# Patient Record
Sex: Male | Born: 1955 | Race: White | Hispanic: No | Marital: Married | Smoking: Never smoker
Health system: Southern US, Community
[De-identification: ages and names within clinical notes are randomized; demographics above are authoritative.]

## PROBLEM LIST (undated history)

## (undated) DIAGNOSIS — S0990XA Unspecified injury of head, initial encounter: Secondary | ICD-10-CM

## (undated) DIAGNOSIS — Z789 Other specified health status: Secondary | ICD-10-CM

## (undated) DIAGNOSIS — J45909 Unspecified asthma, uncomplicated: Secondary | ICD-10-CM

## (undated) DIAGNOSIS — J309 Allergic rhinitis, unspecified: Secondary | ICD-10-CM

## (undated) HISTORY — PX: FRACTURE SURGERY: SHX138

## (undated) HISTORY — DX: Other specified health status: Z78.9

## (undated) HISTORY — PX: OTHER SURGICAL HISTORY: SHX169

## (undated) HISTORY — DX: Unspecified asthma, uncomplicated: J45.909

## (undated) HISTORY — DX: Unspecified injury of head, initial encounter: S09.90XA

## (undated) HISTORY — DX: Allergic rhinitis, unspecified: J30.9

---

## 2012-02-09 DIAGNOSIS — F411 Generalized anxiety disorder: Secondary | ICD-10-CM | POA: Diagnosis not present

## 2012-08-07 DIAGNOSIS — F411 Generalized anxiety disorder: Secondary | ICD-10-CM | POA: Diagnosis not present

## 2013-03-18 DIAGNOSIS — L259 Unspecified contact dermatitis, unspecified cause: Secondary | ICD-10-CM | POA: Diagnosis not present

## 2014-12-25 ENCOUNTER — Telehealth: Payer: Self-pay

## 2014-12-25 NOTE — Telephone Encounter (Signed)
PATIENT WIFE CALLED TO SCHEDULE COLONOSCOPY. RECEIVED LETTER  PLEASE CALL [phone]

## 2014-12-29 NOTE — Telephone Encounter (Signed)
Pt was referred by Dr. Dimas AguasHoward for screening colonoscopy. He said he has abdominal pain that has been going on for awhile and some problems with hemorrhoids. OV with Wynne DustEric Gill, NP on 01/14/2015 at 10:00 AM.

## 2015-01-14 ENCOUNTER — Ambulatory Visit: Payer: Self-pay | Admitting: Nurse Practitioner

## 2015-01-15 ENCOUNTER — Ambulatory Visit (INDEPENDENT_AMBULATORY_CARE_PROVIDER_SITE_OTHER): Payer: Medicare Other | Admitting: Nurse Practitioner

## 2015-01-15 ENCOUNTER — Other Ambulatory Visit: Payer: Self-pay

## 2015-01-15 ENCOUNTER — Encounter: Payer: Self-pay | Admitting: Nurse Practitioner

## 2015-01-15 VITALS — BP 126/72 | HR 67 | Temp 98.4°F | Ht 72.0 in | Wt 194.0 lb

## 2015-01-15 DIAGNOSIS — K5900 Constipation, unspecified: Secondary | ICD-10-CM | POA: Insufficient documentation

## 2015-01-15 DIAGNOSIS — R1030 Lower abdominal pain, unspecified: Secondary | ICD-10-CM

## 2015-01-15 DIAGNOSIS — K6289 Other specified diseases of anus and rectum: Secondary | ICD-10-CM

## 2015-01-15 DIAGNOSIS — Z1211 Encounter for screening for malignant neoplasm of colon: Secondary | ICD-10-CM | POA: Diagnosis not present

## 2015-01-15 DIAGNOSIS — R109 Unspecified abdominal pain: Secondary | ICD-10-CM

## 2015-01-15 MED ORDER — PEG-KCL-NACL-NASULF-NA ASC-C 100 G PO SOLR
1.0000 | Freq: Once | ORAL | Status: AC
Start: 2015-01-15 — End: 2015-02-14

## 2015-01-15 MED ORDER — HYDROCORTISONE ACETATE 25 MG RE SUPP: 25.0000 mg | Freq: Two times a day (BID) | RECTAL | Status: DC

## 2015-01-15 NOTE — Assessment & Plan Note (Signed)
Occasional lower abdominal pain, no pain today. No TTP. Has regular BMs but occasional incomplete emptying. Abdominal pain likely due to mild intermittent constipation/incomplete emptying. No red flag/warning symptoms/signs such as overt GI bleed, weight loss, change in bowel habits, or appetite. Will trial on Miralax 3 times a week. Due for a screening colonoscopy and will set that up. If no improvement in abdominal pain with Miralax and TCS is clean can consider further workup and treatment like abdominal imaging or different medication regimen.

## 2015-01-15 NOTE — Assessment & Plan Note (Signed)
Due to TCS, referred by PCP. Is having some lower GI pain intermittently along with sensation of incomplete emptying and rectal irritation/itching. Likely transient constipation, but cannot rule out other etiology. Will proceed with screening TCS as he is due. If no improvement in intermittent abdominal pain with medications and TCS is normal, can consider other workup such as abdominal imaging.  Proceed with TCS with Dr. Jena Gaussourk in near future: the risks, benefits, and alternatives have been discussed with the patient in detail. The patient states understanding and desires to proceed.

## 2015-01-15 NOTE — Progress Notes (Signed)
cc'ed to pcp °

## 2015-01-15 NOTE — Patient Instructions (Signed)
1. We will schedule you for your procedure (colonoscopy) 2. Further recommendations to be based on the results of your procedure 3. Use the Annusol suppository twice a day as needed for rectal irritation. Do not use for longer than 7 days at a time. 4. Try Miralax thrww times a week for more complete emptying of your bowels.

## 2015-01-15 NOTE — Assessment & Plan Note (Signed)
Patient with rectal irritation and itching with bowel movements. Has not tried any medications. No s/s of overt GI bleed. Likely hemorrhoids, rectal exam deferred due to pending screenign colonoscopy. Will Rx Anusol suppository bid for up to 7 days for symptomatic treatment.

## 2015-01-15 NOTE — Assessment & Plan Note (Signed)
Has regular bowel movement but a sensation of incomplete emptying as well as likely hemorrhoids ractal irritation. Denies s/s of overt GI bleed. Wil trial Miralax three times a day. Due for screening colonoscopy and will defer rectal exam until then.

## 2015-01-15 NOTE — Progress Notes (Signed)
Primary Care Physician:  Rory Percy, MD Primary Gastroenterologist:  Dr. Oneida Alar  Chief Complaint  Patient presents with  . Colonoscopy    HPI:   59 year old mae presents for screenign colonoscopy; unable to telepone triage because c/o persistnet abdominal pain and hemorrhoid issues. Abdominal pain is in his lower abdomen and is intermittent. Has about 1 bowel movement a day although admits sensation of incomplete emptying. Having rectal irritation and itching with each bowel movement. Had one episode of scant brbpr on toilet tissue a couple months ago, no recurrence. Has not tried any medications. Denies NSAID use, ASA powder use. Denies dysphagia, GERD symptoms. Does not like to take a lot of medications. Denies unintentional weight loss or changes in appetite. No change in bowel habits or consistency.  Past Medical History  Diagnosis Date  . Medical history non-contributory     Past Surgical History  Procedure Laterality Date  . None      Current Outpatient Prescriptions  Medication Sig Dispense Refill  . hydrocortisone (ANUSOL-HC) 25 MG suppository Place 1 suppository (25 mg total) rectally 2 (two) times daily. Do not use for longer than 7 days. 14 suppository 0  . peg 3350 powder (MOVIPREP) 100 G SOLR Take 1 kit (200 g total) by mouth once. 1 kit 0   No current facility-administered medications for this visit.    Allergies as of 01/15/2015  . (No Known Allergies)    Family History  Problem Relation Age of Onset  . Colon cancer Neg Hx     History   Social History  . Marital Status: Married    Spouse Name: N/A    Number of Children: N/A  . Years of Education: N/A   Occupational History  . Not on file.   Social History Main Topics  . Smoking status: Never Smoker   . Smokeless tobacco: Not on file  . Alcohol Use: No  . Drug Use: No  . Sexual Activity: Not on file   Other Topics Concern  . Not on file   Social History Narrative  . No narrative on  file    Review of Systems: Gen: Denies any fever, chills, fatigue, weight loss, lack of appetite.  CV: Denies chest pain, heart palpitations, syncope.  Resp: Denies shortness of breath at rest or with exertion. Denies wheezing. No shortness of breath laying flat. GI: See HPI. Denies dysphagia or odynophagia. Denies fecal incontinence. MS: Denies joint pain, muscle weakness, cramps, or limitation of movement.  Derm: Denies rash, itching, dry skin Psych: Denies depression, anxiety, memory loss, and confusion Heme: Denies bruising, bleeding, and enlarged lymph nodes.  Physical Exam: BP 126/72 mmHg  Pulse 67  Temp(Src) 98.4 F (36.9 C) (Oral)  Ht 6' (1.829 m)  Wt 194 lb (87.998 kg)  BMI 26.31 kg/m2 General:   Alert and oriented. Pleasant and cooperative. Well-nourished and well-developed.  Head:  Normocephalic and atraumatic. Eyes:  Without icterus, sclera clear and conjunctiva pink.  Ears:  Normal auditory acuity. Mouth:  No deformity or lesions, oral mucosa pink. No OP edema. Neck:  Supple, without mass or thyromegaly. Lungs:  Clear to auscultation bilaterally. No wheezes, rales, or rhonchi. No distress.  Heart:  S1, S2 present without murmurs appreciated.  Abdomen:  +BS, soft, non-tender and non-distended with particular attention to the lower abdomen. No HSM noted. No guarding or rebound. No masses appreciated.  Rectal:  Deferred  Msk:  Symmetrical without gross deformities. Normal posture. Pulses:  Normal DP pulses  noted. Extremities:  Without clubbing or edema. Neurologic:  Alert and  oriented x4;  grossly normal neurologically. Skin:  Intact without significant lesions or rashes. Cervical Nodes:  No significant cervical adenopathy. Psych:  Alert and cooperative. Normal mood and affect.     01/15/2015 12:02 PM

## 2015-02-04 ENCOUNTER — Encounter (HOSPITAL_COMMUNITY): Payer: Self-pay | Admitting: *Deleted

## 2015-02-04 ENCOUNTER — Ambulatory Visit (HOSPITAL_COMMUNITY)
Admission: RE | Admit: 2015-02-04 | Discharge: 2015-02-04 | Disposition: A | Discharge status: Home / Self Care | Payer: Medicare Other | Source: Ambulatory Visit | Attending: Internal Medicine | Admitting: Internal Medicine

## 2015-02-04 ENCOUNTER — Encounter (HOSPITAL_COMMUNITY)
Admission: RE | Disposition: A | Discharge status: Home / Self Care | Payer: Self-pay | Source: Ambulatory Visit | Attending: Internal Medicine

## 2015-02-04 DIAGNOSIS — K648 Other hemorrhoids: Secondary | ICD-10-CM | POA: Diagnosis not present

## 2015-02-04 DIAGNOSIS — K644 Residual hemorrhoidal skin tags: Secondary | ICD-10-CM | POA: Diagnosis not present

## 2015-02-04 DIAGNOSIS — R109 Unspecified abdominal pain: Secondary | ICD-10-CM

## 2015-02-04 DIAGNOSIS — K6389 Other specified diseases of intestine: Secondary | ICD-10-CM | POA: Diagnosis not present

## 2015-02-04 DIAGNOSIS — K5900 Constipation, unspecified: Secondary | ICD-10-CM

## 2015-02-04 DIAGNOSIS — K6289 Other specified diseases of anus and rectum: Secondary | ICD-10-CM

## 2015-02-04 DIAGNOSIS — Z1211 Encounter for screening for malignant neoplasm of colon: Secondary | ICD-10-CM | POA: Diagnosis present

## 2015-02-04 HISTORY — PX: COLONOSCOPY: SHX5424

## 2015-02-04 SURGERY — COLONOSCOPY
Anesthesia: Moderate Sedation

## 2015-02-04 MED ORDER — ONDANSETRON HCL 4 MG/2ML IJ SOLN
INTRAMUSCULAR | Status: DC | PRN
  Administered 2015-02-04: 4 mg via INTRAVENOUS

## 2015-02-04 MED ORDER — MEPERIDINE HCL 100 MG/ML IJ SOLN
INTRAMUSCULAR | Status: AC
  Filled 2015-02-04: qty 2

## 2015-02-04 MED ORDER — MIDAZOLAM HCL 5 MG/5ML IJ SOLN
INTRAMUSCULAR | Status: AC
  Filled 2015-02-04: qty 10

## 2015-02-04 MED ORDER — MIDAZOLAM HCL 5 MG/5ML IJ SOLN
INTRAMUSCULAR | Status: DC | PRN
  Administered 2015-02-04: 1 mg via INTRAVENOUS
  Administered 2015-02-04: 2 mg via INTRAVENOUS
  Administered 2015-02-04 (×2): 1 mg via INTRAVENOUS
  Administered 2015-02-04: 2 mg via INTRAVENOUS

## 2015-02-04 MED ORDER — MEPERIDINE HCL 100 MG/ML IJ SOLN
INTRAMUSCULAR | Status: DC | PRN
  Administered 2015-02-04: 25 mg via INTRAVENOUS
  Administered 2015-02-04: 50 mg via INTRAVENOUS

## 2015-02-04 MED ORDER — SODIUM CHLORIDE 0.9 % IV SOLN
INTRAVENOUS | Status: DC
  Administered 2015-02-04: 1000 mL via INTRAVENOUS

## 2015-02-04 MED ORDER — STERILE WATER FOR IRRIGATION IR SOLN
Status: DC | PRN
  Administered 2015-02-04: 15:00:00

## 2015-02-04 MED ORDER — ONDANSETRON HCL 4 MG/2ML IJ SOLN
INTRAMUSCULAR | Status: AC
  Filled 2015-02-04: qty 2

## 2015-02-04 NOTE — H&P (View-Only) (Signed)
  Primary Care Physician:  HOWARD, KEVIN, MD Primary Gastroenterologist:  Dr. Fields  Chief Complaint  Patient presents with  . Colonoscopy    HPI:   58 year old mae presents for screenign colonoscopy; unable to telepone triage because c/o persistnet abdominal pain and hemorrhoid issues. Abdominal pain is in his lower abdomen and is intermittent. Has about 1 bowel movement a day although admits sensation of incomplete emptying. Having rectal irritation and itching with each bowel movement. Had one episode of scant brbpr on toilet tissue a couple months ago, no recurrence. Has not tried any medications. Denies NSAID use, ASA powder use. Denies dysphagia, GERD symptoms. Does not like to take a lot of medications. Denies unintentional weight loss or changes in appetite. No change in bowel habits or consistency.  Past Medical History  Diagnosis Date  . Medical history non-contributory     Past Surgical History  Procedure Laterality Date  . None      Current Outpatient Prescriptions  Medication Sig Dispense Refill  . hydrocortisone (ANUSOL-HC) 25 MG suppository Place 1 suppository (25 mg total) rectally 2 (two) times daily. Do not use for longer than 7 days. 14 suppository 0  . peg 3350 powder (MOVIPREP) 100 G SOLR Take 1 kit (200 g total) by mouth once. 1 kit 0   No current facility-administered medications for this visit.    Allergies as of 01/15/2015  . (No Known Allergies)    Family History  Problem Relation Age of Onset  . Colon cancer Neg Hx     History   Social History  . Marital Status: Married    Spouse Name: N/A    Number of Children: N/A  . Years of Education: N/A   Occupational History  . Not on file.   Social History Main Topics  . Smoking status: Never Smoker   . Smokeless tobacco: Not on file  . Alcohol Use: No  . Drug Use: No  . Sexual Activity: Not on file   Other Topics Concern  . Not on file   Social History Narrative  . No narrative on  file    Review of Systems: Gen: Denies any fever, chills, fatigue, weight loss, lack of appetite.  CV: Denies chest pain, heart palpitations, syncope.  Resp: Denies shortness of breath at rest or with exertion. Denies wheezing. No shortness of breath laying flat. GI: See HPI. Denies dysphagia or odynophagia. Denies fecal incontinence. MS: Denies joint pain, muscle weakness, cramps, or limitation of movement.  Derm: Denies rash, itching, dry skin Psych: Denies depression, anxiety, memory loss, and confusion Heme: Denies bruising, bleeding, and enlarged lymph nodes.  Physical Exam: BP 126/72 mmHg  Pulse 67  Temp(Src) 98.4 F (36.9 C) (Oral)  Ht 6' (1.829 m)  Wt 194 lb (87.998 kg)  BMI 26.31 kg/m2 General:   Alert and oriented. Pleasant and cooperative. Well-nourished and well-developed.  Head:  Normocephalic and atraumatic. Eyes:  Without icterus, sclera clear and conjunctiva pink.  Ears:  Normal auditory acuity. Mouth:  No deformity or lesions, oral mucosa pink. No OP edema. Neck:  Supple, without mass or thyromegaly. Lungs:  Clear to auscultation bilaterally. No wheezes, rales, or rhonchi. No distress.  Heart:  S1, S2 present without murmurs appreciated.  Abdomen:  +BS, soft, non-tender and non-distended with particular attention to the lower abdomen. No HSM noted. No guarding or rebound. No masses appreciated.  Rectal:  Deferred  Msk:  Symmetrical without gross deformities. Normal posture. Pulses:  Normal DP pulses   noted. Extremities:  Without clubbing or edema. Neurologic:  Alert and  oriented x4;  grossly normal neurologically. Skin:  Intact without significant lesions or rashes. Cervical Nodes:  No significant cervical adenopathy. Psych:  Alert and cooperative. Normal mood and affect.     01/15/2015 12:02 PM  

## 2015-02-04 NOTE — Op Note (Signed)
[facility] [address] [place], [id number]   1COLONOSCOPY PROCEDURE REPORT  PATIENT: Timothy Yates, Timothy Yates  MR#: [id number] BIRTHDATE: May 09, 1956 , 58  yrs. old GENDER: male ENDOSCOPIST: R.  Roetta SessionsMichael Leilyn Frayre, MD FACP [facility] REFERRED XB:JYNWGBY:Kevin Howard, M.D. PROCEDURE DATE:  02/04/2015 PROCEDURE:   Colonoscopy, diagnostic and Colonoscopy, screening INDICATIONS:First-ever average risk colorectal cancer screening examination. MEDICATIONS: Versed 7 mg IV and Demerol 75 mg IV in divided doses. Zofran 4 mg IV. ASA CLASS:       Class II  CONSENT: The risks, benefits, alternatives and imponderables including but not limited to bleeding, perforation as well as the possibility of a missed lesion have been reviewed.  The potential for biopsy, lesion removal, etc. have also been discussed. Questions have been answered.  All parties agreeable.  Please see the history and physical in the medical record for more information.  DESCRIPTION OF PROCEDURE:   After the risks benefits and alternatives of the procedure were thoroughly explained, informed consent was obtained.  The digital rectal exam revealed no abnormalities of the rectum.   The EC-[id number] ([id number]  endoscope was introduced through the anus and advanced to the cecum, which was identified by both the appendix and ileocecal valve. No adverse events experienced.   The quality of the prep was adequate  The instrument was then slowly withdrawn as the colon was fully examined.      COLON FINDINGS: Normal rectum.  Somewhat redundant colon.  However, the colonic mucosa appeared normal.  Retroflexion was performed. .   Withdrawal time=8 minutes 0 seconds.  The scope was withdrawn and the procedure completed. COMPLICATIONS: There were no immediate complications.  ENDOSCOPIC IMPRESSION: Internal hemorrhoids and anal papilla; otherwise, normal colonoscopy.  RECOMMENDATIONS: Patient has some constipation and anal irritation.  Will prescribe Anusol suppositories 1 per rectum twice daily -  10 days. Begin Benefiber 2 teaspoons twice daily. MiraLAX 17 g orally bedtime as needed for constipation. Repeat colonoscopy for screening purposes in 10 years.  eSigned:  R. Roetta SessionsMichael Yeison Sippel, MD Jerrel IvoryFACP [facility] 02/04/2015 3:26 PM   cc:  CPT CODES: ICD CODES:  The ICD and CPT codes recommended by this software are interpretations from the data that the clinical staff has captured with the software.  The verification of the translation of this report to the ICD and CPT codes and modifiers is the sole responsibility of the health care institution and practicing physician where this report was generated.  [employer] will not be held responsible for the validity of the ICD and CPT codes included on this report.  AMA assumes no liability for data contained or not contained herein. CPT is a Publishing rights managerregistered trademark of the [employer].

## 2015-02-04 NOTE — Discharge Instructions (Signed)
Colonoscopy Discharge Instructions  Read the instructions outlined below and refer to this sheet in the next few weeks. These discharge instructions provide you with general information on caring for yourself after you leave the hospital. Your doctor may also give you specific instructions. While your treatment has been planned according to the most current medical practices available, unavoidable complications occasionally occur. If you have any problems or questions after discharge, call Dr. Jena Gauss at [phone]. ACTIVITY  You may resume your regular activity, but move at a slower pace for the next 24 hours.   Take frequent rest periods for the next 24 hours.   Walking will help get rid of the air and reduce the bloated feeling in your belly (abdomen).   No driving for 24 hours (because of the medicine (anesthesia) used during the test).    Do not sign any important legal documents or operate any machinery for 24 hours (because of the anesthesia used during the test).  NUTRITION  Drink plenty of fluids.   You may resume your normal diet as instructed by your doctor.   Begin with a light meal and progress to your normal diet. Heavy or fried foods are harder to digest and may make you feel sick to your stomach (nauseated).   Avoid alcoholic beverages for 24 hours or as instructed.  MEDICATIONS  You may resume your normal medications unless your doctor tells you otherwise.  WHAT YOU CAN EXPECT TODAY  Some feelings of bloating in the abdomen.   Passage of more gas than usual.   Spotting of blood in your stool or on the toilet paper.  IF YOU HAD POLYPS REMOVED DURING THE COLONOSCOPY:  No aspirin products for 7 days or as instructed.   No alcohol for 7 days or as instructed.   Eat a soft diet for the next 24 hours.  FINDING OUT THE RESULTS OF YOUR TEST Not all test results are available during your visit. If your test results are not back during the visit, make an appointment  with your caregiver to find out the results. Do not assume everything is normal if you have not heard from your caregiver or the medical facility. It is important for you to follow up on all of your test results.  SEEK IMMEDIATE MEDICAL ATTENTION IF:  You have more than a spotting of blood in your stool.   Your belly is swollen (abdominal distention).   You are nauseated or vomiting.   You have a temperature over 101.   You have abdominal pain or discomfort that is severe or gets worse throughout the day.    Constipation and hemorrhoid information provided  Ten-day course of Anusol suppositories 1 per rectum twice daily  Begin Benefiber 2 teaspoons twice daily indefinitely  Use MiraLAX 17 g orally at bedtime as needed for constipation  Repeat colonoscopy in 10 years for screening purposes.  Hemorrhoids Hemorrhoids are swollen veins around the rectum or anus. There are two types of hemorrhoids:   Internal hemorrhoids. These occur in the veins just inside the rectum. They may poke through to the outside and become irritated and painful.  External hemorrhoids. These occur in the veins outside the anus and can be felt as a painful swelling or hard lump near the anus. CAUSES  Pregnancy.   Obesity.   Constipation or diarrhea.   Straining to have a bowel movement.   Sitting for long periods on the toilet.  Heavy lifting or other activity that caused you to  strain.  Anal intercourse. SYMPTOMS   Pain.   Anal itching or irritation.   Rectal bleeding.   Fecal leakage.   Anal swelling.   One or more lumps around the anus.  DIAGNOSIS  Your caregiver may be able to diagnose hemorrhoids by visual examination. Other examinations or tests that may be performed include:   Examination of the rectal area with a gloved hand (digital rectal exam).   Examination of anal canal using a small tube (scope).   A blood test if you have lost a significant amount of  blood.  A test to look inside the colon (sigmoidoscopy or colonoscopy). TREATMENT Most hemorrhoids can be treated at home. However, if symptoms do not seem to be getting better or if you have a lot of rectal bleeding, your caregiver may perform a procedure to help make the hemorrhoids get smaller or remove them completely. Possible treatments include:   Placing a rubber band at the base of the hemorrhoid to cut off the circulation (rubber band ligation).   Injecting a chemical to shrink the hemorrhoid (sclerotherapy).   Using a tool to burn the hemorrhoid (infrared light therapy).   Surgically removing the hemorrhoid (hemorrhoidectomy).   Stapling the hemorrhoid to block blood flow to the tissue (hemorrhoid stapling).  HOME CARE INSTRUCTIONS   Eat foods with fiber, such as whole grains, beans, nuts, fruits, and vegetables. Ask your doctor about taking products with added fiber in them (fibersupplements).  Increase fluid intake. Drink enough water and fluids to keep your urine clear or pale yellow.   Exercise regularly.   Go to the bathroom when you have the urge to have a bowel movement. Do not wait.   Avoid straining to have bowel movements.   Keep the anal area dry and clean. Use wet toilet paper or moist towelettes after a bowel movement.   Medicated creams and suppositories may be used or applied as directed.   Only take over-the-counter or prescription medicines as directed by your caregiver.   Take warm sitz baths for 15-20 minutes, 3-4 times a day to ease pain and discomfort.   Place ice packs on the hemorrhoids if they are tender and swollen. Using ice packs between sitz baths may be helpful.   Put ice in a plastic bag.   Place a towel between your skin and the bag.   Leave the ice on for 15-20 minutes, 3-4 times a day.   Do not use a donut-shaped pillow or sit on the toilet for long periods. This increases blood pooling and pain.  SEEK MEDICAL  CARE IF:  You have increasing pain and swelling that is not controlled by treatment or medicine.  You have uncontrolled bleeding.  You have difficulty or you are unable to have a bowel movement.  You have pain or inflammation outside the area of the hemorrhoids. MAKE SURE YOU:  Understand these instructions.  Will watch your condition.  Will get help right away if you are not doing well or get worse. Document Released: 12/02/2000 Document Revised: 11/21/2012 Document Reviewed: 10/09/2012 [facility] Patient Information 2015 [employer] [place]. This information is not intended to replace advice given to you by your health care provider. Make sure you discuss any questions you have with your health care provider.   Constipation Constipation is when a person has fewer than three bowel movements a week, has difficulty having a bowel movement, or has stools that are dry, hard, or larger than normal. As people grow older, constipation is  more common. If you try to fix constipation with medicines that make you have a bowel movement (laxatives), the problem may get worse. Long-term laxative use may cause the muscles of the colon to become weak. A low-fiber diet, not taking in enough fluids, and taking certain medicines may make constipation worse.  CAUSES   Certain medicines, such as antidepressants, pain medicine, iron supplements, antacids, and water pills.   Certain diseases, such as diabetes, irritable bowel syndrome (IBS), thyroid disease, or depression.   Not drinking enough water.   Not eating enough fiber-rich foods.   Stress or travel.   Lack of physical activity or exercise.   Ignoring the urge to have a bowel movement.   Using laxatives too much.  SIGNS AND SYMPTOMS   Having fewer than three bowel movements a week.   Straining to have a bowel movement.   Having stools that are hard, dry, or larger than normal.   Feeling full or bloated.   Pain in the lower  abdomen.   Not feeling relief after having a bowel movement.  DIAGNOSIS  Your health care provider will take a medical history and perform a physical exam. Further testing may be done for severe constipation. Some tests may include:  A barium enema X-ray to examine your rectum, colon, and, sometimes, your small intestine.   A sigmoidoscopy to examine your lower colon.   A colonoscopy to examine your entire colon. TREATMENT  Treatment will depend on the severity of your constipation and what is causing it. Some dietary treatments include drinking more fluids and eating more fiber-rich foods. Lifestyle treatments may include regular exercise. If these diet and lifestyle recommendations do not help, your health care provider may recommend taking over-the-counter laxative medicines to help you have bowel movements. Prescription medicines may be prescribed if over-the-counter medicines do not work.  HOME CARE INSTRUCTIONS   Eat foods that have a lot of fiber, such as fruits, vegetables, whole grains, and beans.  Limit foods high in fat and processed sugars, such as french fries, hamburgers, cookies, candies, and soda.   A fiber supplement may be added to your diet if you cannot get enough fiber from foods.   Drink enough fluids to keep your urine clear or pale yellow.   Exercise regularly or as directed by your health care provider.   Go to the restroom when you have the urge to go. Do not hold it.   Only take over-the-counter or prescription medicines as directed by your health care provider. Do not take other medicines for constipation without talking to your health care provider first.  SEEK IMMEDIATE MEDICAL CARE IF:   You have bright red blood in your stool.   Your constipation lasts for more than 4 days or gets worse.   You have abdominal or rectal pain.   You have thin, pencil-like stools.   You have unexplained weight loss. MAKE SURE YOU:   Understand these  instructions.  Will watch your condition.  Will get help right away if you are not doing well or get worse. Document Released: 09/02/2004 Document Revised: 12/10/2013 Document Reviewed: 09/16/2013 [facility] Patient Information 2015 [place], [place]. This information is not intended to replace advice given to you by your health care provider. Make sure you discuss any questions you have with your health care provider.

## 2015-02-04 NOTE — Interval H&P Note (Signed)
History and Physical Interval Note:  02/04/2015 2:47 PM  Timothy Yates  has presented today for surgery, with the diagnosis of abd pain, rectal irritation, constipation, screening  The various methods of treatment have been discussed with the patient and family. After consideration of risks, benefits and other options for treatment, the patient has consented to  Procedure(s) with comments: COLONOSCOPY (N/A) - 115pm as a surgical intervention .  The patient's history has been reviewed, patient examined, no change in status, stable for surgery.  I have reviewed the patient's chart and labs.  Questions were answered to the patient's satisfaction.     [name]  No change. First ever screening colonoscopy today.The risks, benefits, limitations, alternatives and imponderables have been reviewed with the patient. Potential for esophageal dilation, biopsy, etc. have also been reviewed.  Questions have been answered. All parties agreeable.

## 2015-02-05 ENCOUNTER — Encounter (HOSPITAL_COMMUNITY): Payer: Self-pay | Admitting: Internal Medicine

## 2015-06-16 DIAGNOSIS — H40033 Anatomical narrow angle, bilateral: Secondary | ICD-10-CM | POA: Diagnosis not present

## 2015-06-16 DIAGNOSIS — H2513 Age-related nuclear cataract, bilateral: Secondary | ICD-10-CM | POA: Diagnosis not present

## 2015-09-01 DIAGNOSIS — L820 Inflamed seborrheic keratosis: Secondary | ICD-10-CM | POA: Diagnosis not present

## 2015-09-01 DIAGNOSIS — R350 Frequency of micturition: Secondary | ICD-10-CM | POA: Diagnosis not present

## 2015-10-12 DIAGNOSIS — R0602 Shortness of breath: Secondary | ICD-10-CM | POA: Diagnosis not present

## 2016-01-20 DIAGNOSIS — R5383 Other fatigue: Secondary | ICD-10-CM | POA: Diagnosis not present

## 2016-01-20 DIAGNOSIS — F419 Anxiety disorder, unspecified: Secondary | ICD-10-CM | POA: Diagnosis not present

## 2016-01-20 DIAGNOSIS — E7800 Pure hypercholesterolemia, unspecified: Secondary | ICD-10-CM | POA: Diagnosis not present

## 2016-01-27 DIAGNOSIS — F419 Anxiety disorder, unspecified: Secondary | ICD-10-CM | POA: Diagnosis not present

## 2016-01-27 DIAGNOSIS — Z0000 Encounter for general adult medical examination without abnormal findings: Secondary | ICD-10-CM | POA: Diagnosis not present

## 2017-03-22 DIAGNOSIS — Z6828 Body mass index (BMI) 28.0-28.9, adult: Secondary | ICD-10-CM | POA: Diagnosis not present

## 2017-03-22 DIAGNOSIS — Z1389 Encounter for screening for other disorder: Secondary | ICD-10-CM | POA: Diagnosis not present

## 2017-03-22 DIAGNOSIS — F419 Anxiety disorder, unspecified: Secondary | ICD-10-CM | POA: Diagnosis not present

## 2018-01-31 DIAGNOSIS — Z0000 Encounter for general adult medical examination without abnormal findings: Secondary | ICD-10-CM | POA: Diagnosis not present

## 2018-01-31 DIAGNOSIS — Z6828 Body mass index (BMI) 28.0-28.9, adult: Secondary | ICD-10-CM | POA: Diagnosis not present

## 2018-01-31 DIAGNOSIS — F419 Anxiety disorder, unspecified: Secondary | ICD-10-CM | POA: Diagnosis not present

## 2018-04-19 ENCOUNTER — Ambulatory Visit (INDEPENDENT_AMBULATORY_CARE_PROVIDER_SITE_OTHER): Payer: Medicare Other

## 2018-04-19 ENCOUNTER — Ambulatory Visit (INDEPENDENT_AMBULATORY_CARE_PROVIDER_SITE_OTHER): Payer: Medicare Other | Admitting: Orthopaedic Surgery

## 2018-04-19 ENCOUNTER — Encounter (INDEPENDENT_AMBULATORY_CARE_PROVIDER_SITE_OTHER): Payer: Self-pay | Admitting: Orthopaedic Surgery

## 2018-04-19 VITALS — BP 153/90 | HR 66 | Ht 72.0 in | Wt 190.0 lb

## 2018-04-19 DIAGNOSIS — M25551 Pain in right hip: Secondary | ICD-10-CM | POA: Diagnosis not present

## 2018-04-19 NOTE — Progress Notes (Signed)
Office Visit Note   Patient: Timothy Yates           Date of Birth: 1956/12/03           MRN: [id number] Visit Date: 04/19/2018              Requested by: Selinda Flavin, MD [address] [place], [place] [address] PCP: Selinda Flavin, MD   Assessment & Plan: Visit Diagnoses:  1. Pain in right hip     Plan: Patient may have had some hip tendinitis or transient synovitis.  Currently he is walking well without pain.  He will return if he has recurrence of symptoms.  X-rays of his hip were negative.  Follow-Up Instructions: No follow-ups on file.   Orders:  Orders Placed This Encounter  Procedures  . XR HIP UNILAT W OR W/O PELVIS 2-3 VIEWS RIGHT   No orders of the defined types were placed in this encounter.     Procedures: No procedures performed   Clinical Data: No additional findings.   Subjective: Chief Complaint  Patient presents with  . Right Hip - Pain    HPI 62 year old male states he had immediate sharp onset of right hip pain he thinks either at the either 3 or 4 days ago.  He states he could barely walk but could not stand had trouble getting around.  He had to use a walker that was available.  He had the rest and states gradually is gotten better and today when he comes in the office for evaluation his hip is feeling good without pain.  He denies any rheumatologic conditions no previous history of hip surgery.  He denies any associated back pain with this but has had some back arthritis problems before previous lumbar surgery, lumbar laminectomy.  Patient denies chills or fever.  Review of Systems 14 point review of system positive for history of constipation, rectal irritation, abdominal pain.  Patient non-smoker does not drink.  Negative for GERD.  Otherwise negative as it pertains HPI.   Objective: Vital Signs: BP (!) 153/90   Pulse 66   Ht 6' (1.829 m)   Wt 190 lb (86.2 kg)   BMI 25.77 kg/m   Physical Exam  Constitutional: He is oriented to person,  place, and time. He appears well-developed and well-nourished.  HENT:  Head: Normocephalic and atraumatic.  Eyes: Pupils are equal, round, and reactive to light. EOM are normal.  Neck: No tracheal deviation present. No thyromegaly present.  Cardiovascular: Normal rate.  Pulmonary/Chest: Effort normal. He has no wheezes.  Abdominal: Soft. Bowel sounds are normal.  Neurological: He is alert and oriented to person, place, and time.  Skin: Skin is warm and dry. Capillary refill takes less than 2 seconds.  Psychiatric: He has a normal mood and affect. His behavior is normal. Judgment and thought content normal.    Ortho Exam patient has good hip range of motion no hip flexion contracture no discomfort with internal rotation.  Normal pulse in the groin..  No sciatic notch tenderness.  Tib gastrocsoleus heel and toe walking is normal. Specialty Comments:  No specialty comments available.  Imaging: No results found.   PMFS History: Patient Active Problem List   Diagnosis Date Noted  . Abdominal pain, lower 01/15/2015  . Constipation 01/15/2015  . Rectal irritation 01/15/2015  . Encounter for screening colonoscopy 01/15/2015   Past Medical History:  Diagnosis Date  . Medical history non-contributory     Family History  Problem Relation  Age of Onset  . Colon cancer Neg Hx     Past Surgical History:  Procedure Laterality Date  . COLONOSCOPY N/A 02/04/2015   Procedure: COLONOSCOPY;  Surgeon: Corbin Ade, MD;  Location: [facility];  Service: Endoscopy;  Laterality: N/A;  115pm  . FRACTURE SURGERY Left    knee  . None     Social History   Occupational History  . Not on file  Tobacco Use  . Smoking status: Never Smoker  . Smokeless tobacco: Never Used  Substance and Sexual Activity  . Alcohol use: No    Alcohol/week: 0.0 oz  . Drug use: No  . Sexual activity: Not on file

## 2018-04-23 ENCOUNTER — Encounter (INDEPENDENT_AMBULATORY_CARE_PROVIDER_SITE_OTHER): Payer: Self-pay | Admitting: Orthopaedic Surgery

## 2018-09-25 NOTE — Progress Notes (Signed)
REVIEWED-NO ADDITIONAL RECOMMENDATIONS. 

## 2018-11-13 DIAGNOSIS — J209 Acute bronchitis, unspecified: Secondary | ICD-10-CM | POA: Diagnosis not present

## 2018-11-13 DIAGNOSIS — Z6827 Body mass index (BMI) 27.0-27.9, adult: Secondary | ICD-10-CM | POA: Diagnosis not present

## 2018-12-21 DIAGNOSIS — J209 Acute bronchitis, unspecified: Secondary | ICD-10-CM | POA: Diagnosis not present

## 2018-12-21 DIAGNOSIS — J441 Chronic obstructive pulmonary disease with (acute) exacerbation: Secondary | ICD-10-CM | POA: Diagnosis not present

## 2018-12-21 DIAGNOSIS — Z6828 Body mass index (BMI) 28.0-28.9, adult: Secondary | ICD-10-CM | POA: Diagnosis not present

## 2019-12-05 DIAGNOSIS — Z6827 Body mass index (BMI) 27.0-27.9, adult: Secondary | ICD-10-CM | POA: Diagnosis not present

## 2019-12-05 DIAGNOSIS — R109 Unspecified abdominal pain: Secondary | ICD-10-CM | POA: Diagnosis not present

## 2019-12-05 DIAGNOSIS — R300 Dysuria: Secondary | ICD-10-CM | POA: Diagnosis not present

## 2020-04-14 ENCOUNTER — Encounter: Payer: Self-pay | Admitting: *Deleted

## 2020-04-15 ENCOUNTER — Encounter: Payer: Self-pay | Admitting: Cardiology

## 2020-04-15 ENCOUNTER — Encounter: Payer: Self-pay | Admitting: *Deleted

## 2020-04-15 ENCOUNTER — Other Ambulatory Visit: Payer: Self-pay

## 2020-04-15 ENCOUNTER — Ambulatory Visit (INDEPENDENT_AMBULATORY_CARE_PROVIDER_SITE_OTHER): Payer: Medicare Other | Admitting: Cardiology

## 2020-04-15 VITALS — BP 128/78 | HR 62 | Ht 72.0 in | Wt 187.4 lb

## 2020-04-15 DIAGNOSIS — R0789 Other chest pain: Secondary | ICD-10-CM

## 2020-04-15 DIAGNOSIS — Z8249 Family history of ischemic heart disease and other diseases of the circulatory system: Secondary | ICD-10-CM

## 2020-04-15 NOTE — Progress Notes (Signed)
Cardiology Office Note  Date: 04/15/2020   ID: Timothy Yates, DOB 04/08/1956, MRN [id number]  PCP:  Timothy Flavin, MD  Cardiologist:  Nona Dell, MD Electrophysiologist:  None   Chief Complaint  Patient presents with  . Chest discomfort    History of Present Illness: Timothy Yates is a 64 y.o. male referred for cardiology consultation by [name] PA-C with [facility].  The provided records do not indicate a reason for consultation.  In talking with Timothy Yates, he is concerned about intermittent episodes of thoracic discomfort that have been occurring sporadically over the last several months.  He reports a sharp pain that goes from his left shoulder into his chest, sometimes this lasts for 45 minutes, he cannot identify any specific precipitant.  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  His brother (who sees Timothy Yates) does have a history of premature CAD.  He is currently not on any long-term prescription medications.  I personally reviewed his ECG today which shows sinus bradycardia, normal intervals.  Past Medical History:  Diagnosis Date  . Medical history non-contributory     Past Surgical History:  Procedure Laterality Date  . COLONOSCOPY N/A 02/04/2015   Procedure: COLONOSCOPY;  Surgeon: Corbin Ade, MD;  Location: [facility];  Service: Endoscopy;  Laterality: N/A;  115pm  . FRACTURE SURGERY Left    Knee    Current Outpatient Medications  Medication Sig Dispense Refill  . L-ARGININE PO Take 2 tablets by mouth every morning.    . Multiple Vitamin (MULTIVITAMIN WITH MINERALS) TABS tablet Take 1 tablet by mouth daily.    . Nutritional Supplements (DHEA PO) Take 2 tablets by mouth daily.    . Saw Palmetto, Serenoa repens, (SAW PALMETTO PO) Take 1 tablet by mouth daily.     No current facility-administered medications for this visit.   Allergies:   Patient has no known allergies.   Social History: The patient  reports that he has never smoked. He has never used smokeless tobacco. He reports that he does not drink alcohol or use drugs.   Family History: The patient's family history includes COPD in his father; Dementia in his father; Heart disease in his brother; Hyperthyroidism in his mother; Swallowing difficulties in his mother; Thyroid disease in his brother, sister, sister, sister, and sister.   ROS:  No palpitations or syncope.  Physical Exam: VS:  BP 128/78   Pulse 62   Ht 6' (1.829 m)   Wt 187 lb 6.4 oz (85 kg)   SpO2 97%   BMI 25.42 kg/m , BMI Body mass index is 25.42 kg/m.  Wt Readings from Last 3 Encounters:  04/15/20 187 lb 6.4 oz (85 kg)  04/19/18 190 lb (86.2 kg)  02/04/15 194 lb (88 kg)    General: Patient appears comfortable at rest. HEENT: Conjunctiva and lids normal, wearing a mask. Neck: Supple, no elevated JVP or carotid bruits, no thyromegaly. Lungs: Clear to auscultation, nonlabored breathing at rest. Cardiac: Regular rate and rhythm, no S3 or significant systolic murmur, no pericardial rub. Abdomen: Soft, nontender, bowel sounds present. Extremities: No pitting edema, distal pulses 2+. Skin: Warm and dry. Musculoskeletal: No kyphosis. Neuropsychiatric: Alert and oriented x3, affect grossly appropriate.   ECG:  No prior tracings for review today.  Recent Labwork:  No prior lab work available for review today.  Other Studies Reviewed Today:  No  previous cardiac testing for review today.  Assessment and Plan:  Atypical thoracic pain, no definite exertional precipitant.  Patient is 64 years old with family history of premature CAD in a brother, no clear personal history of hypertension, hyperlipidemia, or type 2 diabetes mellitus.  ECG today is nonspecific.  Requesting lab work from [facility] regarding lipid status.  We will plan a GXT for ischemic evaluation.  Medication Adjustments/Labs and  Tests Ordered: Current medicines are reviewed at length with the patient today.  Concerns regarding medicines are outlined above.   Tests Ordered: Orders Placed This Encounter  Procedures  . EXERCISE TOLERANCE TEST (ETT)  . EKG 12-Lead    Medication Changes: No orders of the defined types were placed in this encounter.   Disposition:  Follow up test results.  Signed, Satira Sark, MD, [facility] 04/15/2020 11:07 AM    [facility] [address], [place], [place] [address] Phone: [phone]; Fax: [phone]

## 2020-04-15 NOTE — Progress Notes (Signed)
After going over stress test instructions with patient, he said that he did not think he wanted to have the stress test or covid test. He says he will discuss this with his wife and call office back if he decides to have GXT. [place] informed.

## 2020-04-15 NOTE — Patient Instructions (Addendum)
Medication Instructions:    Your physician recommends that you continue on your current medications as directed. Please refer to the Current Medication list given to you today.  Labwork:  Your physician recommends that you return for a covid test 2-3 days before your stress test. After your covid test, you will need to go home and quarantine until your stress test is completed.-will be scheduled after your stress test is scheduled  Testing/Procedures: Your physician has requested that you have an exercise tolerance test. For further information please visit [email or web address] Please also follow instruction sheet, as given.  Follow-Up:  Your physician recommends that you schedule a follow-up appointment in: pending.  Any Other Special Instructions Will Be Listed Below (If Applicable).  If you need a refill on your cardiac medications before your next appointment, please call your pharmacy.

## 2020-12-15 DIAGNOSIS — Z6827 Body mass index (BMI) 27.0-27.9, adult: Secondary | ICD-10-CM | POA: Diagnosis not present

## 2020-12-15 DIAGNOSIS — J449 Chronic obstructive pulmonary disease, unspecified: Secondary | ICD-10-CM | POA: Diagnosis not present

## 2021-04-22 ENCOUNTER — Encounter: Payer: Self-pay | Admitting: Internal Medicine

## 2021-04-22 ENCOUNTER — Other Ambulatory Visit: Payer: Self-pay

## 2021-04-22 ENCOUNTER — Ambulatory Visit (INDEPENDENT_AMBULATORY_CARE_PROVIDER_SITE_OTHER): Payer: Medicare Other | Admitting: Internal Medicine

## 2021-04-22 ENCOUNTER — Ambulatory Visit (HOSPITAL_COMMUNITY)
Admission: RE | Admit: 2021-04-22 | Discharge: 2021-04-22 | Disposition: A | Discharge status: Home / Self Care | Payer: Medicare Other | Source: Ambulatory Visit | Attending: Internal Medicine | Admitting: Internal Medicine

## 2021-04-22 DIAGNOSIS — R0600 Dyspnea, unspecified: Secondary | ICD-10-CM | POA: Diagnosis not present

## 2021-04-22 DIAGNOSIS — R0609 Other forms of dyspnea: Secondary | ICD-10-CM

## 2021-04-22 MED ORDER — BUDESONIDE-FORMOTEROL FUMARATE 80-4.5 MCG/ACT IN AERO: INHALATION_SPRAY | RESPIRATORY_TRACT | 11 refills | Status: AC

## 2021-04-22 NOTE — Patient Instructions (Addendum)
Plan A = Automatic = Always=    Symbicort 80 Take 2 puffs first thing in am and then another 2 puffs about 12 hours later.   Work on inhaler technique:  relax and gently blow all the way out then take a nice smooth deep breath back in, triggering the inhaler at same time you start breathing in.  Hold for up to 5 seconds if you can. Blow out thru nose. Rinse and gargle with water when done      Plan B = Backup (to supplement plan A, not to replace it) Only use your albuterol inhaler as a rescue medication to be used if you can't catch your breath by resting or doing a relaxed purse lip breathing pattern.  - The less you use it, the better it will work when you need it. - Ok to use the inhaler up to 2 puffs  every 4 hours if you must but call for appointment if use goes up over your usual need - Don't leave home without it !!  (think of it like the spare tire for your car)   Plan C = Crisis (instead of Plan B but only if Plan B stops working) - only use your albuterol nebulizer if you first try Plan B and it fails to help > ok to use the nebulizer up to every 4 hours but if start needing it regularly call for immediate appointment  GERD (REFLUX)  is an extremely common cause of respiratory symptoms just like yours , many times with no obvious heartburn at all.    It can be treated with medication, but also with lifestyle changes including elevation of the head of your bed (ideally with 6 -8inch blocks under the headboard of your bed),  Smoking cessation, avoidance of late meals, excessive alcohol, and avoid fatty foods, chocolate, peppermint, colas, red wine, and acidic juices such as orange juice.  NO MINT OR MENTHOL PRODUCTS SO NO COUGH DROPS  USE SUGARLESS CANDY INSTEAD (Jolley ranchers or Stover's or Life Savers) or even ice chips will also do - the key is to swallow to prevent all throat clearing. NO OIL BASED VITAMINS - use powdered substitutes.  Avoid fish oil when coughing.  If cough not  better > Try prilosec otc 20mg   Take 30-60 min before first meal of the day and Pepcid ac (famotidine) 20 mg one after supper until cough is completely gone for at least a week without the need for cough suppression      Please remember to go to the lab and x-ray department at [facility]   for your tests - we will call you with the results when they are available.        Please schedule a follow up office visit in 4 weeks, sooner if needed  with all medications /inhalers/ solutions in hand so we can verify exactly what you are taking. This includes all medications from all doctors and over the counters   PFTs on return - don't use any inhalers the night before

## 2021-04-22 NOTE — Progress Notes (Signed)
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 Pulmonary Consult    Referred by Dr Fara Chute. Former Dr Juanetta Gosling pt. Pt states has been having SOB for the past 6 yrs. He also c/o cough with clear sputum. He states his SOB occurs when exposed to very hot or very cold temperatures.    Dyspnea:  No problem going to store but extremely sedentary since covid as never vaccinated  Cough: clear mucus minimal vol/ frequency uses lots of menthol cough drops  Sleep: once a week wakes and  feels needs to use hfa  SABA use: 8 x weekly /once or twice a month uses neb / ? helping  No obvious day to day or daytime variability or assoc purulent sputum or mucus plugs or hemoptysis or cp or chest tightness, subjective wheeze or overt sinus or hb symptoms.    Also denies any   other aggravating or alleviating factors except as outlined above   No unusual exposure hx or h/o childhood pna/ asthma or knowledge of premature birth.  Current Allergies, Complete Past Medical History, Past Surgical History, Family History, and Social History were reviewed in [facility] record.  ROS  The following are not active complaints unless bolded Hoarseness, sore throat, dysphagia, dental problems, itching, sneezing,  nasal congestion or discharge of excess mucus or purulent secretions, ear ache,   fever, chills, sweats, unintended wt loss or wt gain, classically pleuritic or exertional cp,  orthopnea pnd or arm/hand swelling  or leg swelling, presyncope, palpitations, abdominal pain, anorexia, nausea, vomiting, diarrhea  or change in bowel habits or change in bladder  habits, change in stools or change in urine, dysuria, hematuria,  rash, arthralgias, visual complaints, headache, numbness, weakness or ataxia or problems with walking or coordination,  change in mood or  memory.           Past Medical History:  Diagnosis Date  . Medical history non-contributory     Outpatient Medications Prior to Visit  Medication Sig Dispense Refill  . albuterol (VENTOLIN HFA) 108 (90 Base) MCG/ACT inhaler Inhale 2 puffs into the lungs every 6 (six) hours as needed for wheezing or shortness of breath.    . L-ARGININE PO Take 2 tablets by mouth every morning.    . Multiple Vitamin (MULTIVITAMIN WITH MINERALS) TABS tablet Take 1 tablet by mouth daily.    . Nutritional Supplements (DHEA PO) Take 2 tablets by mouth daily.    . Saw Palmetto, Serenoa repens, (SAW PALMETTO PO) Take 1 tablet by mouth daily.     No facility-administered medications prior to visit.     Objective:     BP (!) 154/84 (BP Location: Left Arm, Cuff Size: Normal)   Pulse 65   Temp 98.3 F (36.8 C) (Temporal)   Ht 6' (1.829 m)   Wt 196 lb (88.9 kg)   SpO2 99% Comment: on RA  BMI 26.58 kg/m   SpO2: 99 % (on RA)   amb wm nad - freq throat clearing    HEENT : pt wearing mask not removed for exam due  to covid -19 concerns.    NECK :  without JVD/Nodes/TM/ nl carotid upstrokes bilaterally   LUNGS: no acc muscle use,  Nl contour chest which is clear to A and P bilaterally without cough on insp or exp maneuvers   CV:  RRR  no s3 or murmur or increase in P2, and no edema   ABD:  soft and nontender with nl inspiratory excursion in the supine position. No bruits or organomegaly appreciated, bowel sounds nl  MS:  Nl gait/ ext warm without deformities, calf tenderness, cyanosis or clubbing No obvious joint restrictions   SKIN: warm and dry without lesions    NEURO:  alert, approp, nl sensorium with  no motor or cerebellar deficits apparent.    CXR PA and Lateral:   04/22/2021 :    I  personally reviewed images /impression as follows:   No acute changes/ no evidence of asbestosis or pleural plaques   Labs ordered 04/22/2021  :  allergy profile   alpha one AT phenotype             Assessment   DOE (dyspnea on exertion) Onset was 2016 -  04/22/2021   Walked RA  approx   200 ft  @ moderate pace  stopped due to  Dizziness/ no sob/ sats 98%  - 04/22/2021 Trial of symb 80 2bid pending return for pfts prior to am symbicort     Symptoms are markedly disproportionate to objective findings and not clear to what extent this is actually a pulmonary  problem but pt does appear to have difficult to sort out respiratory symptoms of unknown origin for which  DDX  = almost all start with A and  include Adherence, Ace Inhibitors, Acid Reflux, Active Sinus Disease, Alpha 1 Antitripsin deficiency, Anxiety masquerading as Airways dz,  ABPA,  Allergy(esp in young), Aspiration (esp in elderly), Adverse effects of meds,  Active smoking or Vaping, A bunch of PE's/clot burden (a few small clots can't cause this syndrome unless there is already severe underlying pulm or vascular dz with poor reserve),  Anemia or thyroid disorder, plus two Bs  = Bronchiectasis and Beta blocker use..and one C= CHF     Adherence is always the initial "prime suspect" and is a multilayered concern that requires a "trust but verify" approach in every patient - starting with knowing how to use medications, especially inhalers, correctly, keeping up with refills and understanding the fundamental difference between maintenance and prns vs those medications only taken for a very short course and then stopped and not refilled.  - - The proper method of use, as well as anticipated side effects, of a metered-dose inhaler were discussed and demonstrated to the patient using teach back method.   - return with all meds in hand using a trust but verify approach to confirm accurate Medication  Reconciliation The principal here is that until  we are certain that the  patients are doing what we've asked, it makes no sense to ask them to do more.   ? Acid (or non-acid) GERD > always difficult to exclude as up to 75% of pts in some series report no assoc GI/ Heartburn symptoms> rec max (24h)  acid suppression and diet restrictions/ reviewed and instructions given in writing.   ? Allergy /asthma > check profile, start low dose symbicort  ? Anxiety/depression/ deconditioning  > usually at the bottom of this list of usual suspects but   may interfere with adherence and also interpretation of response  or lack thereof to symptom management which can be quite subjective.    Each maintenance medication was reviewed in detail including emphasizing most importantly the difference between maintenance and prns and under what circumstances the prns are to be triggered using an action plan format where appropriate.  Total time for H and P, chart review, counseling, reviewing hfa  device(s) , directly observing portions of ambulatory 02 saturation study/ and generating customized AVS unique to this office visit / same day charting  > 60 min                   Sandrea Hughs, MD 04/22/2021

## 2021-04-23 ENCOUNTER — Encounter: Payer: Self-pay | Admitting: Internal Medicine

## 2021-04-23 ENCOUNTER — Telehealth: Payer: Self-pay | Admitting: Internal Medicine

## 2021-04-23 NOTE — Telephone Encounter (Signed)
I called and spoke with the pts wife and she stated that he was one the advair before and it did not help him.  She stated that she called and spoke with the pharmacist and they told her that he could get the symbicort for $142 a month so they are going to do this.  Will forward to MW to make him aware.

## 2021-04-23 NOTE — Telephone Encounter (Signed)
pts wife called back and she stated that she spoke with the pharmacy and the insurance about the medication.  They are telling her that they have to pay $100 + for the deductible and then the cost for the symbicort is going to still be about $500 a month.  They cannot afford this.    It looks like the symbicort is not covered by his insurance is why this is so expensive. Listed alternatives would be  Advair HFA   45, 115, 230 Breo Fluticasone-Salmeterol  55, 113, 232  MW please advise. Thanks

## 2021-04-23 NOTE — Telephone Encounter (Signed)
Ok to substitute advair hfa 45  Take 2 puffs first thing in am and then another 2 puffs about 12 hours later.

## 2021-04-23 NOTE — Assessment & Plan Note (Signed)
Onset was 2016 -  04/22/2021   Walked RA  approx   200 ft  @ moderate pace  stopped due to  Dizziness/ no sob/ sats 98%  - 04/22/2021 Trial of symb 80 2bid pending return for pfts prior to am symbicort     Symptoms are markedly disproportionate to objective findings and not clear to what extent this is actually a pulmonary  problem but pt does appear to have difficult to sort out respiratory symptoms of unknown origin for which  DDX  = almost all start with A and  include Adherence, Ace Inhibitors, Acid Reflux, Active Sinus Disease, Alpha 1 Antitripsin deficiency, Anxiety masquerading as Airways dz,  ABPA,  Allergy(esp in young), Aspiration (esp in elderly), Adverse effects of meds,  Active smoking or Vaping, A bunch of PE's/clot burden (a few small clots can't cause this syndrome unless there is already severe underlying pulm or vascular dz with poor reserve),  Anemia or thyroid disorder, plus two Bs  = Bronchiectasis and Beta blocker use..and one C= CHF     Adherence is always the initial "prime suspect" and is a multilayered concern that requires a "trust but verify" approach in every patient - starting with knowing how to use medications, especially inhalers, correctly, keeping up with refills and understanding the fundamental difference between maintenance and prns vs those medications only taken for a very short course and then stopped and not refilled.  - - The proper method of use, as well as anticipated side effects, of a metered-dose inhaler were discussed and demonstrated to the patient using teach back method.   - return with all meds in hand using a trust but verify approach to confirm accurate Medication  Reconciliation The principal here is that until we are certain that the  patients are doing what we've asked, it makes no sense to ask them to do more.   ? Acid (or non-acid) GERD > always difficult to exclude as up to 75% of pts in some series report no assoc GI/ Heartburn symptoms> rec max  (24h)  acid suppression and diet restrictions/ reviewed and instructions given in writing.   ? Allergy /asthma > check profile, start low dose symbicort  ? Anxiety/depression/ deconditioning  > usually at the bottom of this list of usual suspects but   may interfere with adherence and also interpretation of response or lack thereof to symptom management which can be quite subjective.    Each maintenance medication was reviewed in detail including emphasizing most importantly the difference between maintenance and prns and under what circumstances the prns are to be triggered using an action plan format where appropriate.  Total time for H and P, chart review, counseling, reviewing hfa  device(s) , directly observing portions of ambulatory 02 saturation study/ and generating customized AVS unique to this office visit / same day charting  > 60 min

## 2021-04-23 NOTE — Telephone Encounter (Signed)
I have called and LM on VM for the pt to call us back.  

## 2021-04-26 ENCOUNTER — Encounter: Payer: Self-pay | Admitting: *Deleted

## 2021-04-30 ENCOUNTER — Other Ambulatory Visit (HOSPITAL_COMMUNITY): Payer: Medicare Other

## 2021-05-03 ENCOUNTER — Inpatient Hospital Stay (HOSPITAL_COMMUNITY): Admission: RE | Admit: 2021-05-03 | Payer: Medicare Other | Source: Ambulatory Visit

## 2021-05-20 ENCOUNTER — Ambulatory Visit (HOSPITAL_COMMUNITY)
Admission: RE | Admit: 2021-05-20 | Discharge: 2021-05-20 | Disposition: A | Discharge status: Home / Self Care | Payer: Medicare Other | Source: Ambulatory Visit | Attending: Internal Medicine | Admitting: Internal Medicine

## 2021-05-20 ENCOUNTER — Other Ambulatory Visit: Payer: Self-pay

## 2021-05-20 DIAGNOSIS — R0609 Other forms of dyspnea: Secondary | ICD-10-CM

## 2021-05-20 DIAGNOSIS — R0600 Dyspnea, unspecified: Secondary | ICD-10-CM | POA: Insufficient documentation

## 2021-05-20 LAB — PULMONARY FUNCTION TEST
DL/VA % pred: 107 %
DL/VA: 4.42 ml/min/mmHg/L
DLCO unc % pred: 73 %
DLCO unc: 20.98 ml/min/mmHg
FEF 25-75 Post: 0.92 L/sec
FEF 25-75 Pre: 1.98 L/sec
FEF2575-%Change-Post: -53 %
FEF2575-%Pred-Post: 31 %
FEF2575-%Pred-Pre: 66 %
FEV1-%Change-Post: -15 %
FEV1-%Pred-Post: 63 %
FEV1-%Pred-Pre: 75 %
FEV1-Post: 2.37 L
FEV1-Pre: 2.81 L
FEV1FVC-%Change-Post: -2 %
FEV1FVC-%Pred-Pre: 96 %
FEV6-%Change-Post: -11 %
FEV6-%Pred-Post: 70 %
FEV6-%Pred-Pre: 79 %
FEV6-Post: 3.34 L
FEV6-Pre: 3.79 L
FEV6FVC-%Change-Post: 1 %
FEV6FVC-%Pred-Post: 104 %
FEV6FVC-%Pred-Pre: 102 %
FVC-%Change-Post: -13 %
FVC-%Pred-Post: 67 %
FVC-%Pred-Pre: 77 %
FVC-Post: 3.35 L
FVC-Pre: 3.87 L
Post FEV1/FVC ratio: 71 %
Post FEV6/FVC ratio: 100 %
Pre FEV1/FVC ratio: 73 %
Pre FEV6/FVC Ratio: 98 %
RV % pred: 222 %
RV: 5.41 L
TLC % pred: 125 %
TLC: 9.32 L

## 2021-05-20 MED ORDER — ALBUTEROL SULFATE (2.5 MG/3ML) 0.083% IN NEBU
2.5000 mg | INHALATION_SOLUTION | Freq: Once | RESPIRATORY_TRACT | Status: AC
  Administered 2021-05-20: 2.5 mg via RESPIRATORY_TRACT

## 2021-05-25 NOTE — Progress Notes (Signed)
LMTCB

## 2021-05-26 ENCOUNTER — Telehealth: Payer: Self-pay | Admitting: Internal Medicine

## 2021-05-26 NOTE — Progress Notes (Signed)
Patient returned phone call, name and birth date confirmed. Went over PFT results per Dr Wert with patient. See telephone note from today 05/26/21. Nothing further needed at this time.

## 2021-05-26 NOTE — Telephone Encounter (Signed)
Called and spoke with patient who stated he was returning phone call to go over PFT results. Writer went over PFT results per Dr Sherene Sires with patient.    Timothy Cowden, MD  05/22/2021 4:40 PM EDT      Call patient : Study is unremarkable but was done p am inhalers - if he things they're working well for him he should continue then return to office in 3 m with all inhalers/ solutions in hand to regorup re longterm rx    All questions answered and patient expressed full understanding of results and Dr Thurston Hole recommendations. Scheduled 3 month follow up at the [facility] office with Dr Sherene Sires on Monday 08/16/21 at 9am. Patient agreeable with time, date and location. Patient aware to bring inhalers/solutions in to appointment with him per Dr Sherene Sires. Patient aware to call office if he needs a sooner appointment. Nothing further needed at this time.

## 2021-08-16 ENCOUNTER — Other Ambulatory Visit: Payer: Self-pay

## 2021-08-16 ENCOUNTER — Encounter: Payer: Self-pay | Admitting: Internal Medicine

## 2021-08-16 ENCOUNTER — Ambulatory Visit (INDEPENDENT_AMBULATORY_CARE_PROVIDER_SITE_OTHER): Payer: Medicare Other | Admitting: Internal Medicine

## 2021-08-16 VITALS — BP 128/80 | HR 65 | Temp 97.6°F | Ht 72.0 in | Wt 196.6 lb

## 2021-08-16 DIAGNOSIS — R0609 Other forms of dyspnea: Secondary | ICD-10-CM

## 2021-08-16 DIAGNOSIS — R0600 Dyspnea, unspecified: Secondary | ICD-10-CM | POA: Diagnosis not present

## 2021-08-16 NOTE — Patient Instructions (Addendum)
Continue symbicort 80 Take 2 puffs first thing in am and then another 2 puffs about 12 hours later.   Work on inhaler technique:  relax and gently blow all the way out then take a nice smooth full deep breath back in, triggering the inhaler at same time you start breathing in.  Hold for up to 5 seconds if you can. Blow out thru nose. Rinse and gargle with water when done.  If mouth or throat bother you at all,  try brushing teeth/gums/tongue with arm and hammer toothpaste/ make a slurry and gargle and spit out.      We will walk you today   To get the most out of exercise, you need to be continuously aware that you are short of breath, but never out of breath, for at least 30 minutes daily. As you improve, it will actually be easier for you to do the same amount of exercise  in  30 minutes so always push to the level where you are short of breath.  I not making progress after a few weeks we will need to do another test called CPST if your feet aren't the limiting problem     Ok to Try albuterol 15 min before an activity (on alternating days)  that you know would make you short of breath and see if it makes any difference and if makes none then don't take albuterol after activity unless you can't catch your breath as this means it's the resting that helps, not the albuterol.      Please remember to go to the lab department   for your tests - we will call you with the results when they are available.  Please schedule a follow up visit in 3 months but call sooner if needed  with all medications /inhalers/ solutions in hand so we can verify exactly what you are taking. This includes all medications from all doctors and over the counters

## 2021-08-16 NOTE — Progress Notes (Signed)
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 Former Dr Timothy Yates pt. Pt states has been having SOB for the past 6 yrs. He also c/o cough with clear sputum. He states his SOB occurs when exposed to very hot or very cold temperatures.    Dyspnea:  No problem going to store but extremely sedentary since covid as never vaccinated  Cough: clear mucus minimal vol/ frequency uses lots of menthol cough drops  Sleep: once a week wakes and  feels needs to use hfa  SABA use: 8 x weekly /once or twice a month uses neb / ? [employer] Work on inhaler technique:   Plan B = Backup (to supplement plan A, not to replace it) Only use your albuterol inhaler as a rescue medication  Plan C = Crisis (instead of Plan B but only if Plan B stops working) - only use your albuterol nebulizer if you first try Plan B and it fails to help > ok to use the nebulizer up to every 4 hours but if start needing it regularly call for immediate appointment GERD diet reviewed, bed blocks rec  If cough not better > Try prilosec otc 20mg   Take 30-60 min before first meal of the day and Pepcid ac (famotidine) 20 mg one after supper until cough is completely gone for at least a week without the need for cough suppression   Please schedule a follow up office visit in 4 weeks, sooner if needed  with all medications /inhalers/ solutions in hand so we can verify exactly what you are taking. This includes all medications from all doctors and over the counters PFTs on return - don't use any inhalers the night before  Add : did not go to lab as  req    08/16/2021  f/u ov/[place] office/Timothy Yates re: unexplained sob on empty symbicort 80  Chief Complaint  Patient presents with   Follow-up    Breathing is doing well unless he gets exposed to heat. He is using his albuterol inhaler about 3 x per wk if he goes outside.    Dyspnea:  walks dog 10 min / no problem with grocery stores  Cough: none  Sleeping: no cough now  SABA use: only uses p ex  02: none  Covid status: never  Lung cancer screening: never    No obvious day to day or daytime variability or assoc excess/ purulent sputum or mucus plugs or hemoptysis or cp or chest tightness, subjective wheeze or overt sinus or hb symptoms.   Sleeping  without nocturnal  or early am exacerbation  of respiratory  c/o's or need for noct saba. Also denies any obvious fluctuation of symptoms with weather or environmental changes or other aggravating or alleviating factors except as outlined above   No unusual exposure hx or h/o childhood pna/ asthma or knowledge of premature birth.  Current Allergies, Complete Past Medical History, Past Surgical History, Family History, and Social History were reviewed in 08/18/2021 record.  ROS  The following are  not active complaints unless bolded Hoarseness, sore throat, dysphagia, dental problems, itching, sneezing,  nasal congestion or discharge of excess mucus or purulent secretions, ear ache,   fever, chills, sweats, unintended wt loss or wt gain, classically pleuritic or exertional cp,  orthopnea pnd or arm/hand swelling  or leg swelling, presyncope, palpitations, abdominal pain, anorexia, nausea, vomiting, diarrhea  or change in bowel habits or change in bladder habits, change in stools or change in urine, dysuria, hematuria,  rash, arthralgias, visual complaints, headache, numbness, weakness or ataxia or problems with walking or coordination,  change in mood or  memory.        Current Meds  Medication Sig   albuterol (VENTOLIN  HFA) 108 (90 Base) MCG/ACT inhaler Inhale 2 puffs into the lungs every 6 (six) hours as needed for wheezing or shortness of breath.   budesonide-formoterol (SYMBICORT) 80-4.5 MCG/ACT inhaler Take 2 puffs first thing in am and then another 2 puffs about 12 hours later.   L-ARGININE PO Take 2 tablets by mouth every morning.   Multiple Vitamin (MULTIVITAMIN WITH MINERALS) TABS tablet Take 1 tablet by mouth daily.   valACYclovir (VALTREX) 1000 MG tablet Take 1,000 mg by mouth 3 (three) times daily.            Objective:       Wt Readings from Last 3 Encounters:  08/16/21 196 lb 9.6 oz (89.2 kg)  04/22/21 196 lb (88.9 kg)  04/15/20 187 lb 6.4 oz (85 kg)      Vital signs reviewed  08/16/2021  - Note at rest 02 sats  98% on RA   General appearance:    amb somber wm nad   HEENT : pt wearing mask not removed for exam due to covid -19 concerns.    NECK :  without JVD/Nodes/TM/ nl carotid upstrokes bilaterally   LUNGS: no acc muscle use,  Nl contour chest which is clear to A and P bilaterally without cough on insp or exp maneuvers   CV:  RRR  no s3 or murmur or increase in P2, and no edema   ABD:  soft and nontender with nl inspiratory excursion in the supine position. No bruits or organomegaly appreciated, bowel sounds nl  MS:  Nl gait/ ext warm without deformities, calf tenderness, cyanosis or clubbing No obvious joint restrictions   SKIN: warm and dry without lesions    NEURO:  alert, approp, nl sensorium with  no motor or cerebellar deficits apparent.    Labs ordered/ reviewed:      Chemistry      Component Value Date/Time   NA 139 08/16/2021 1006   K 4.3 08/16/2021 1006   CL 103 08/16/2021 1006   CO2 24 08/16/2021 1006   BUN 16 08/16/2021 1006   CREATININE 0.79 08/16/2021 1006      Component Value Date/Time   CALCIUM 9.5 08/16/2021 1006        Lab Results  Component Value Date   WBC 4.9 08/16/2021   HGB 14.2 08/16/2021   HCT 42.0 08/16/2021   MCV 95  08/16/2021   PLT 165 08/16/2021      BNP  08/16/2021 = 8.1                 Assessment

## 2021-08-20 LAB — CBC WITH DIFFERENTIAL/PLATELET
Basophils Absolute: 0.1 10*3/uL (ref 0.0–0.2)
Basos: 1 %
EOS (ABSOLUTE): 0.2 10*3/uL (ref 0.0–0.4)
Eos: 3 %
Hematocrit: 42 % (ref 37.5–51.0)
Hemoglobin: 14.2 g/dL (ref 13.0–17.7)
Immature Grans (Abs): 0 10*3/uL (ref 0.0–0.1)
Immature Granulocytes: 0 %
Lymphocytes Absolute: 1.2 10*3/uL (ref 0.7–3.1)
Lymphs: 24 %
MCH: 32.1 pg (ref 26.6–33.0)
MCHC: 33.8 g/dL (ref 31.5–35.7)
MCV: 95 fL (ref 79–97)
Monocytes Absolute: 0.5 10*3/uL (ref 0.1–0.9)
Monocytes: 11 %
Neutrophils Absolute: 3 10*3/uL (ref 1.4–7.0)
Neutrophils: 61 %
Platelets: 165 10*3/uL (ref 150–450)
RBC: 4.42 x10E6/uL (ref 4.14–5.80)
RDW: 12.2 % (ref 11.6–15.4)
WBC: 4.9 10*3/uL (ref 3.4–10.8)

## 2021-08-20 LAB — BASIC METABOLIC PANEL
BUN/Creatinine Ratio: 20 (ref 10–24)
BUN: 16 mg/dL (ref 8–27)
CO2: 24 mmol/L (ref 20–29)
Calcium: 9.5 mg/dL (ref 8.6–10.2)
Chloride: 103 mmol/L (ref 96–106)
Creatinine, Ser: 0.79 mg/dL (ref 0.76–1.27)
Glucose: 93 mg/dL (ref 65–99)
Potassium: 4.3 mmol/L (ref 3.5–5.2)
Sodium: 139 mmol/L (ref 134–144)
eGFR: 99 mL/min/{1.73_m2} (ref >59)

## 2021-08-20 LAB — ALPHA-1-ANTITRYPSIN PHENOTYP: A-1 Antitrypsin: 137 mg/dL (ref 101–187)

## 2021-08-20 LAB — IGE: IgE (Immunoglobulin E), Serum: 138 IU/mL (ref 6–495)

## 2021-08-20 LAB — BRAIN NATRIURETIC PEPTIDE: BNP: 8.1 pg/mL (ref 0.0–100.0)

## 2021-08-21 ENCOUNTER — Encounter: Payer: Self-pay | Admitting: Internal Medicine

## 2021-08-21 NOTE — Assessment & Plan Note (Addendum)
Onset was 2016 -  04/22/2021   Walked RA  approx   200 ft  @ moderate pace  stopped due to  Dizziness/ no sob/ sats 98%  - 04/22/2021 Trial of symb 80 2bid pending return for pfts prior to am symbicort   -  05/20/21 PFT's  "used inhalers prior"   FEV1  2.81  (75%) ratio 73  Tds,  No significant obst on f/ loop  - 08/16/2021   Walked on RA x  3  lap(s) =  approx 450 @ avg pace, stopped due to end of study with lowest 02 sats 95%  - Allergy profile 08/16/2021 >  Eos 0.2 /  IgE  138 - 08/16/2021  After extensive coaching inhaler device,  effectiveness =    75% > continue empirical symb 80   At this point it appears he may have mild asthma /atopic but only uses hfa p ex which doesn't really prove anything other than he gets sob with ex that resolves at rest.  His symbicort was empty and should in theory relieve any EIA if he really used it consistently/ correctly  Therefore rec symb 80 2bid until return and in meantime instructed :Ok to try albuterol 15 min before an activity (on alternating days)  that you know would usually make you short of breath and see if it makes any difference and if makes none then don't take albuterol after activity unless you can't catch your breath as this means it's the resting that helps, not the albuterol.  Each maintenance medication was reviewed in detail including emphasizing most importantly the difference between maintenance and prns and under what circumstances the prns are to be triggered using an action plan format where appropriate.  Total time for H and P, chart review, counseling, reviewing hfa device(s) , directly observing portions of ambulatory 02 saturation study/ and generating customized AVS unique to this office visit / same day charting = 35 min

## 2021-08-24 ENCOUNTER — Encounter: Payer: Self-pay | Admitting: *Deleted

## 2021-11-15 ENCOUNTER — Ambulatory Visit: Payer: Medicare Other | Admitting: Internal Medicine

## 2021-11-15 NOTE — Progress Notes (Deleted)
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 Office  Chief Complaint  Patient presents with   Pulmonary Consult    Referred by Dr Consuello Masse. Former Dr Luan Pulling pt. Pt states has been having SOB for the past 6 yrs. He also c/o cough with clear sputum. He states his SOB occurs when exposed to very hot or very cold temperatures.    Dyspnea:  No problem going to store but extremely sedentary since covid as never vaccinated  Cough: clear mucus minimal vol/ frequency uses lots of menthol cough drops  Sleep: once a week wakes and  feels needs to use hfa  SABA use: 8 x weekly /once or twice a month uses neb / ? [employer] Work on inhaler technique:   Plan B = Backup (to supplement plan A, not to replace it) Only use your albuterol inhaler as a rescue medication  Plan C = Crisis (instead of Plan B but only if Plan B stops working) - only use your albuterol nebulizer if you first try Plan B and it fails to help > ok to use the nebulizer up to every 4 hours but if start needing it regularly call for immediate appointment GERD diet reviewed, bed blocks rec  If cough not better > Try prilosec otc 20mg   Take 30-60 min before first meal of the day and Pepcid ac (famotidine) 20 mg one after supper until cough is completely gone for at least a week without the need for cough suppression   Please schedule a follow up office visit in 4 weeks, sooner if needed  with all medications /inhalers/ solutions in hand so we can verify exactly what you are taking. This includes all medications from all doctors and over the counters PFTs on return - don't use any inhalers the night before  Add : did not go to lab as  req    08/16/2021  f/u ov/[place] office/Aswad Wandrey re: unexplained sob on empty symbicort 80  Chief Complaint  Patient presents with   Follow-up    Breathing is doing well unless he gets exposed to heat. He is using his albuterol inhaler about 3 x per wk if he goes outside.    Dyspnea:  walks dog 10 min / no problem with grocery stores  Cough: none  Sleeping: no cough now  SABA use: only uses p ex  02: none  Covid status: never  Lung cancer screening: never  Rec Continue symbicort 80 Take 2 puffs first thing in am and then another 2 puffs about 12 hours later.  Work on inhaler technique:  We will walk you today  To get the most out of exercise, you need to be continuously aware that you are short of breath,  Ok to Try albuterol 15 min before an activity (on alternating days)  that you know would make you short of breath Please schedule a follow up visit in 3 months but call sooner if needed  with all medications /inhalers/ solutions in hand   11/15/2021  f/u ov/[place] office/Juvia Aerts re: unexplained doe  maint on ***  No chief complaint on file.   Dyspnea:  ***  Cough: *** Sleeping: *** SABA use: *** 02: *** Covid status: *** Lung cancer screening: ***   No obvious day to day or daytime variability or assoc excess/ purulent sputum or mucus plugs or hemoptysis or cp or chest tightness, subjective wheeze or overt sinus or hb symptoms.   *** without nocturnal  or early am exacerbation  of respiratory  c/o's or need for noct saba. Also denies any obvious fluctuation of symptoms with weather or environmental changes or other aggravating or alleviating factors except as outlined above   No unusual exposure hx or h/o childhood pna/ asthma or knowledge of premature birth.  Current Allergies, Complete Past Medical History, Past Surgical History, Family History, and Social History were reviewed in [employer] record.  ROS  The following are not active complaints  unless bolded Hoarseness, sore throat, dysphagia, dental problems, itching, sneezing,  nasal congestion or discharge of excess mucus or purulent secretions, ear ache,   fever, chills, sweats, unintended wt loss or wt gain, classically pleuritic or exertional cp,  orthopnea pnd or arm/hand swelling  or leg swelling, presyncope, palpitations, abdominal pain, anorexia, nausea, vomiting, diarrhea  or change in bowel habits or change in bladder habits, change in stools or change in urine, dysuria, hematuria,  rash, arthralgias, visual complaints, headache, numbness, weakness or ataxia or problems with walking or coordination,  change in mood or  memory.        No outpatient medications have been marked as taking for the 11/15/21 encounter (Appointment) with Nyoka Cowden, MD.             Objective:       11/15/2021      ***   08/16/21 196 lb 9.6 oz (89.2 kg)  04/22/21 196 lb (88.9 kg)  04/15/20 187 lb 6.4 oz (85 kg)      Vital signs reviewed  11/15/2021  - Note at rest 02 sats  ***% on ***   General appearance:    ***                   Assessment

## 2021-11-16 ENCOUNTER — Other Ambulatory Visit: Payer: Self-pay

## 2021-11-16 ENCOUNTER — Encounter: Payer: Self-pay | Admitting: Internal Medicine

## 2021-11-16 ENCOUNTER — Ambulatory Visit (INDEPENDENT_AMBULATORY_CARE_PROVIDER_SITE_OTHER): Payer: Medicare Other | Admitting: Internal Medicine

## 2021-11-16 DIAGNOSIS — R0609 Other forms of dyspnea: Secondary | ICD-10-CM

## 2021-11-16 NOTE — Patient Instructions (Addendum)
To get the most out of exercise, you need to be continuously aware that you are short of breath, but never out of breath, for at least 30 minutes daily. As you improve, it will actually be easier for you to do the same amount of exercise  in  30 minutes so always push to the level where you are short of breath.     For nasal symptoms > clariton over the counter daily as needed   Pulmonary follow up is as needed > if not happy with your progress or nasal symptoms I would recommend allergy evaluation and let your primary care provider refill it.

## 2021-11-16 NOTE — Progress Notes (Signed)
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 Former Dr Luan Pulling pt. Pt states has been having SOB for the past 6 yrs. He also c/o cough with clear sputum. He states his SOB occurs when exposed to very hot or very cold temperatures.    Dyspnea:  No problem going to store but extremely sedentary since covid as never vaccinated  Cough: clear mucus minimal vol/ frequency uses lots of menthol cough drops  Sleep: once a week wakes and  feels needs to use hfa  SABA use: 8 x weekly /once or twice a month uses neb / ? [employer] Work on inhaler technique:   Plan B = Backup (to supplement plan A, not to replace it) Only use your albuterol inhaler as a rescue medication  Plan C = Crisis (instead of Plan B but only if Plan B stops working) - only use your albuterol nebulizer if you first try Plan B and it fails to help > ok to use the nebulizer up to every 4 hours but if start needing it regularly call for immediate appointment GERD diet reviewed, bed blocks rec  If cough not better > Try prilosec otc 20mg   Take 30-60 min before first meal of the day and Pepcid ac (famotidine) 20 mg one after supper until cough is completely gone for at least a week without the need for cough suppression   Please schedule a follow up office visit in 4 weeks, sooner if needed  with all medications /inhalers/ solutions in hand so we can verify exactly what you are taking. This includes all medications from all doctors and over the counters PFTs on return - don't use any inhalers the night before  Add : did not go to lab as  req    08/16/2021  f/u ov/[place] office/[employer] re: unexplained sob on empty symbicort 80  Chief Complaint  Patient presents with   Follow-up    Breathing is doing well unless he gets exposed to heat. He is using his albuterol inhaler about 3 x per wk if he goes outside.    Dyspnea:  walks dog 10 min / no problem with grocery stores  Cough: none  Sleeping: no cough now  SABA use: only uses p ex  02: none  Covid status: never  Lung cancer screening: never  Rec Continue symbicort 80 Take 2 puffs first thing in am and then another 2 puffs about 12 hours later.  Work on inhaler technique:  We will walk you today  To get the most out of exercise, you need to be continuously aware that you are short of breath,  Ok to Try albuterol 15 min before an activity (on alternating days)  that you know would make you short of breath Please schedule a follow up visit in 3 months but call sooner if needed  with all medications /inhalers/ solutions in hand   11/16/2021  f/u ov/[place] office/Meliyah Simon re: unexplained doe  maint on symbicort 80 2bid   Chief Complaint  Patient presents  with   Follow-up    Feels SOB has improved since last OV  Dyspnea:  walking dog, grocery shopping / main doe is p intercourse Cough: some pnds,  better  on symbicort blown out thru nose, has not tried nasal steroids or clariton Sleeping: 3 x per month/ wakes up with sob/ occ ventolin and sometimes better s it  SABA use: 3 x month  02: none  Covid status: never vax/ never vaccinated      No obvious day to day or daytime variability or assoc excess/ purulent sputum or mucus plugs or hemoptysis or cp or chest tightness, subjective wheeze or overt sinus or hb symptoms.    Also denies any obvious fluctuation of symptoms with weather or environmental changes or other aggravating or alleviating factors except as outlined above   No unusual exposure hx or h/o childhood pna/ asthma or knowledge of premature  birth.  Current Allergies, Complete Past Medical History, Past Surgical History, Family History, and Social History were reviewed in [employer] record.  ROS  The following are not active complaints unless bolded Hoarseness, sore throat, dysphagia, dental problems, itching, sneezing,  nasal congestion or discharge of excess mucus or purulent secretions, ear ache,   fever, chills, sweats, unintended wt loss or wt gain, classically pleuritic or exertional cp,  orthopnea pnd or arm/hand swelling  or leg swelling, presyncope, palpitations, abdominal pain, anorexia, nausea, vomiting, diarrhea  or change in bowel habits or change in bladder habits, change in stools or change in urine, dysuria, hematuria,  rash, arthralgias, visual complaints, headache, numbness, weakness or ataxia or problems with walking or coordination,  change in mood or  memory.        Current Meds  Medication Sig   albuterol (VENTOLIN HFA) 108 (90 Base) MCG/ACT inhaler Inhale 2 puffs into the lungs every 6 (six) hours as needed for wheezing or shortness of breath.   budesonide-formoterol (SYMBICORT) 80-4.5 MCG/ACT inhaler Take 2 puffs first thing in am and then another 2 puffs about 12 hours later.   L-ARGININE PO Take 2 tablets by mouth every morning.   Multiple Vitamin (MULTIVITAMIN WITH MINERALS) TABS tablet Take 1 tablet by mouth daily.   valACYclovir (VALTREX) 1000 MG tablet Take 1,000 mg by mouth 3 (three) times daily.             Objective:       11/16/2021     200   08/16/21 196 lb 9.6 oz (89.2 kg)  04/22/21 196 lb (88.9 kg)  04/15/20 187 lb 6.4 oz (85 kg)      Vital signs reviewed  11/16/2021  - Note at rest 02 sats  97% on RA   General appearance:    amb wm nad   HEENT : pt wearing mask not removed for exam due to covid -19 concerns.    NECK :  without JVD/Nodes/TM/ nl carotid upstrokes bilaterally   LUNGS: no acc muscle use,  Nl contour chest which is clear to A and P  bilaterally without cough on insp or exp maneuvers   CV:  RRR  no s3 or murmur or increase in P2, and no edema   ABD:  soft and nontender with nl inspiratory excursion in the supine position. No bruits or organomegaly appreciated, bowel sounds nl  MS:  Nl gait/ ext warm without deformities, calf tenderness, cyanosis or clubbing No obvious joint restrictions   SKIN: warm and dry without lesions    NEURO:  alert, approp,  nl sensorium with  no motor or cerebellar deficits apparent.                    Assessment

## 2021-11-16 NOTE — Assessment & Plan Note (Signed)
Onset was 2016 -  04/22/2021   Walked RA  approx   200 ft  @ moderate pace  stopped due to  Dizziness/ no sob/ sats 98%  - 04/22/2021 Trial of symb 80 2bid pending return for pfts prior to am symbicort   -  05/20/21 PFT's  "used inhalers prior"   FEV1  2.81  (75%) ratio 73  Tds,  No significant obst on f/ loop  - 08/16/2021   Walked on RA x  3  lap(s) =  approx 450 @ avg pace, stopped due to end of study with lowest 02 sats 95%  - Allergy profile 08/16/2021 >  Eos 0.2 /  IgE  138 - 08/16/2021  After extensive coaching inhaler device,  effectiveness =    75% > continue empirical symb 80  - 11/16/2021  After extensive coaching inhaler device,  effectiveness =    90%   Overall pattern c/w asthma that improved on symbicort but mainly now issue is conditioning  rec reconditioning and f/u prn with allergy re rhinitis issues         Each maintenance medication was reviewed in detail including emphasizing most importantly the difference between maintenance and prns and under what circumstances the prns are to be triggered using an action plan format where appropriate.  Total time for H and P, chart review, counseling, reviewing hfa  device(s) and generating customized AVS unique to this office visit / same day charting = 25 min       rec

## 2022-05-10 DIAGNOSIS — N529 Male erectile dysfunction, unspecified: Secondary | ICD-10-CM | POA: Diagnosis not present

## 2022-05-10 DIAGNOSIS — I10 Essential (primary) hypertension: Secondary | ICD-10-CM | POA: Diagnosis not present

## 2022-05-10 DIAGNOSIS — Z6828 Body mass index (BMI) 28.0-28.9, adult: Secondary | ICD-10-CM | POA: Diagnosis not present

## 2022-05-10 DIAGNOSIS — H61892 Other specified disorders of left external ear: Secondary | ICD-10-CM | POA: Diagnosis not present

## 2022-05-20 IMAGING — DX DG CHEST 2V
2 series · 2 of 2 positions shown · non-contrast
Comparison: None.

CLINICAL DATA: Productive cough, shortness of breath

EXAM:
CHEST - 2 VIEW

[chest pa]
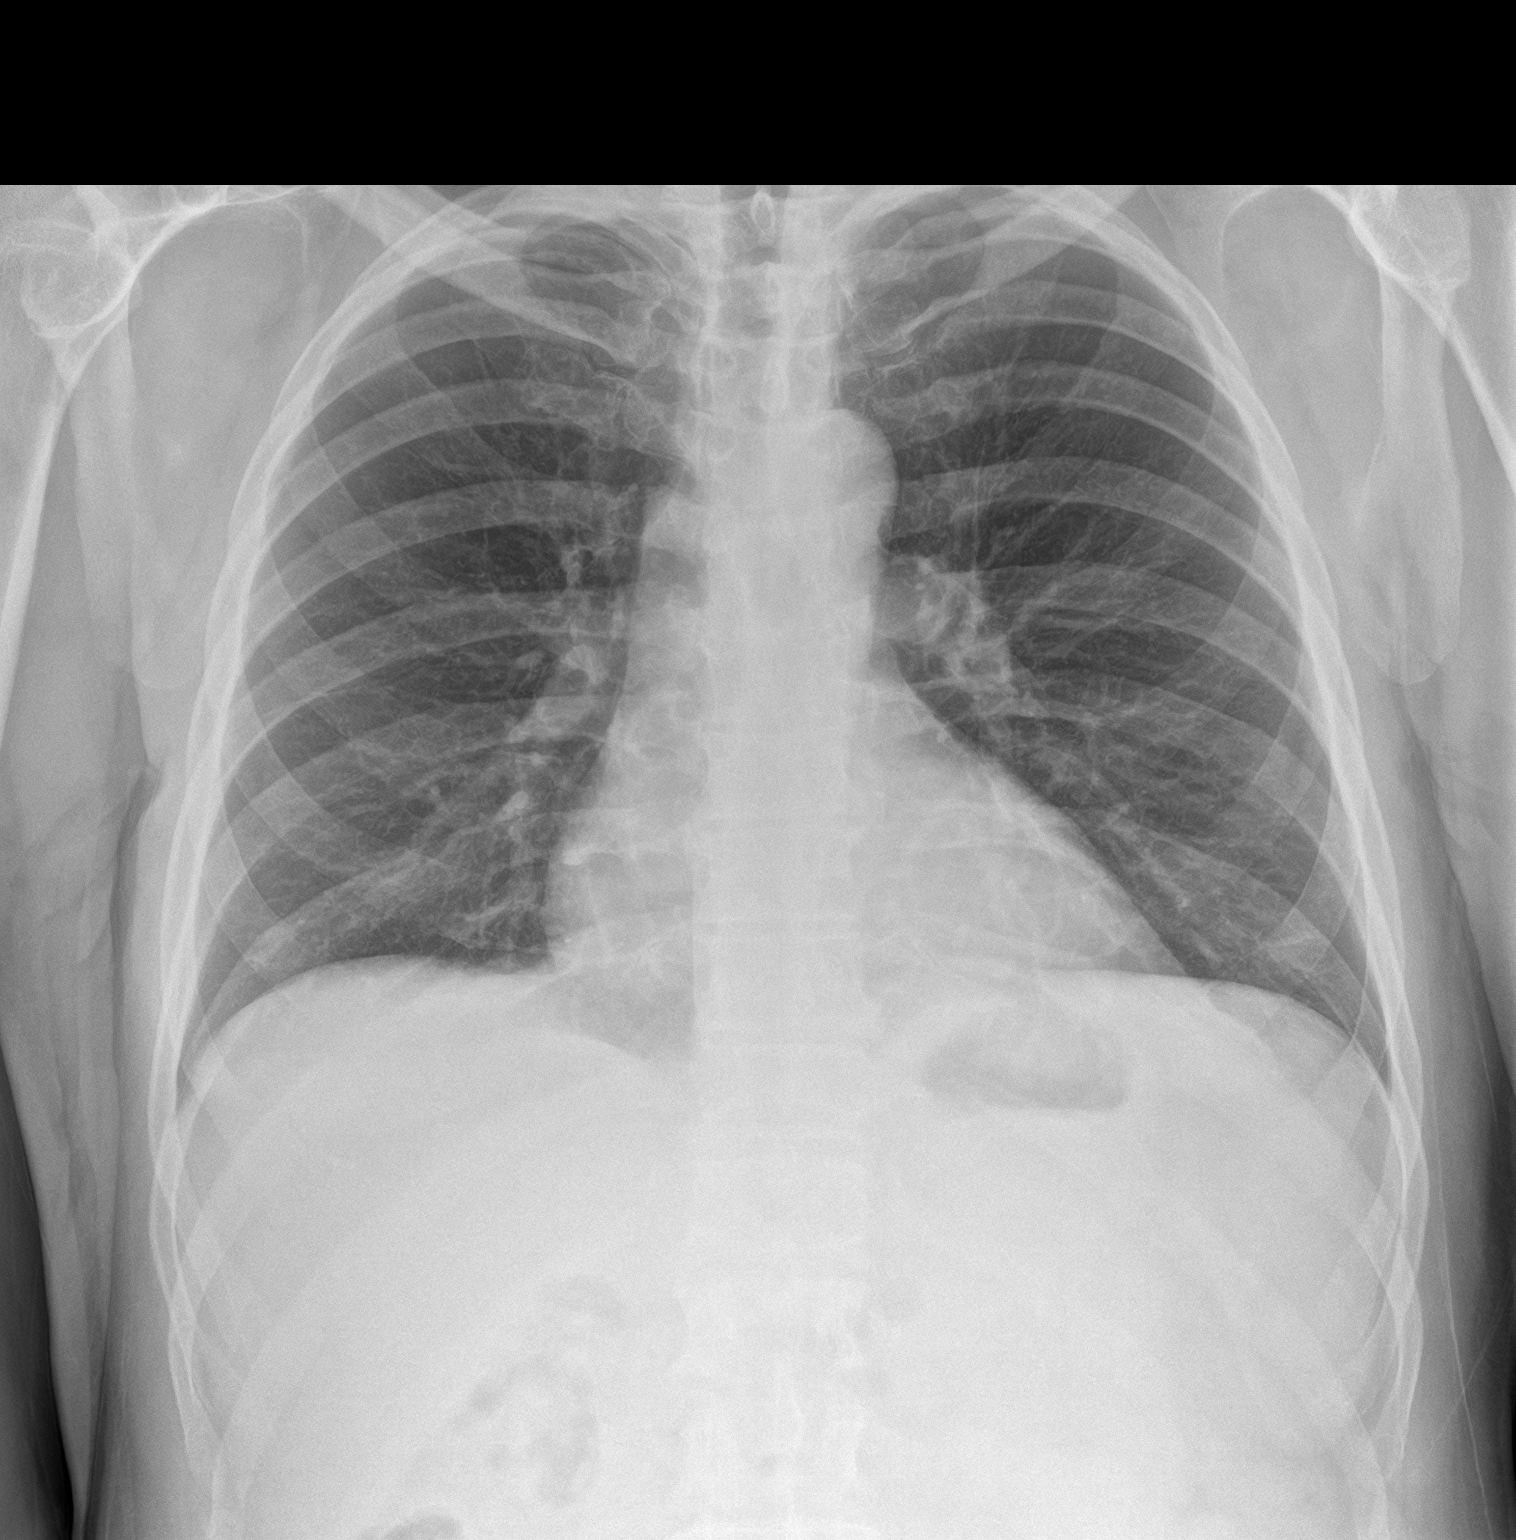

[chest lat]
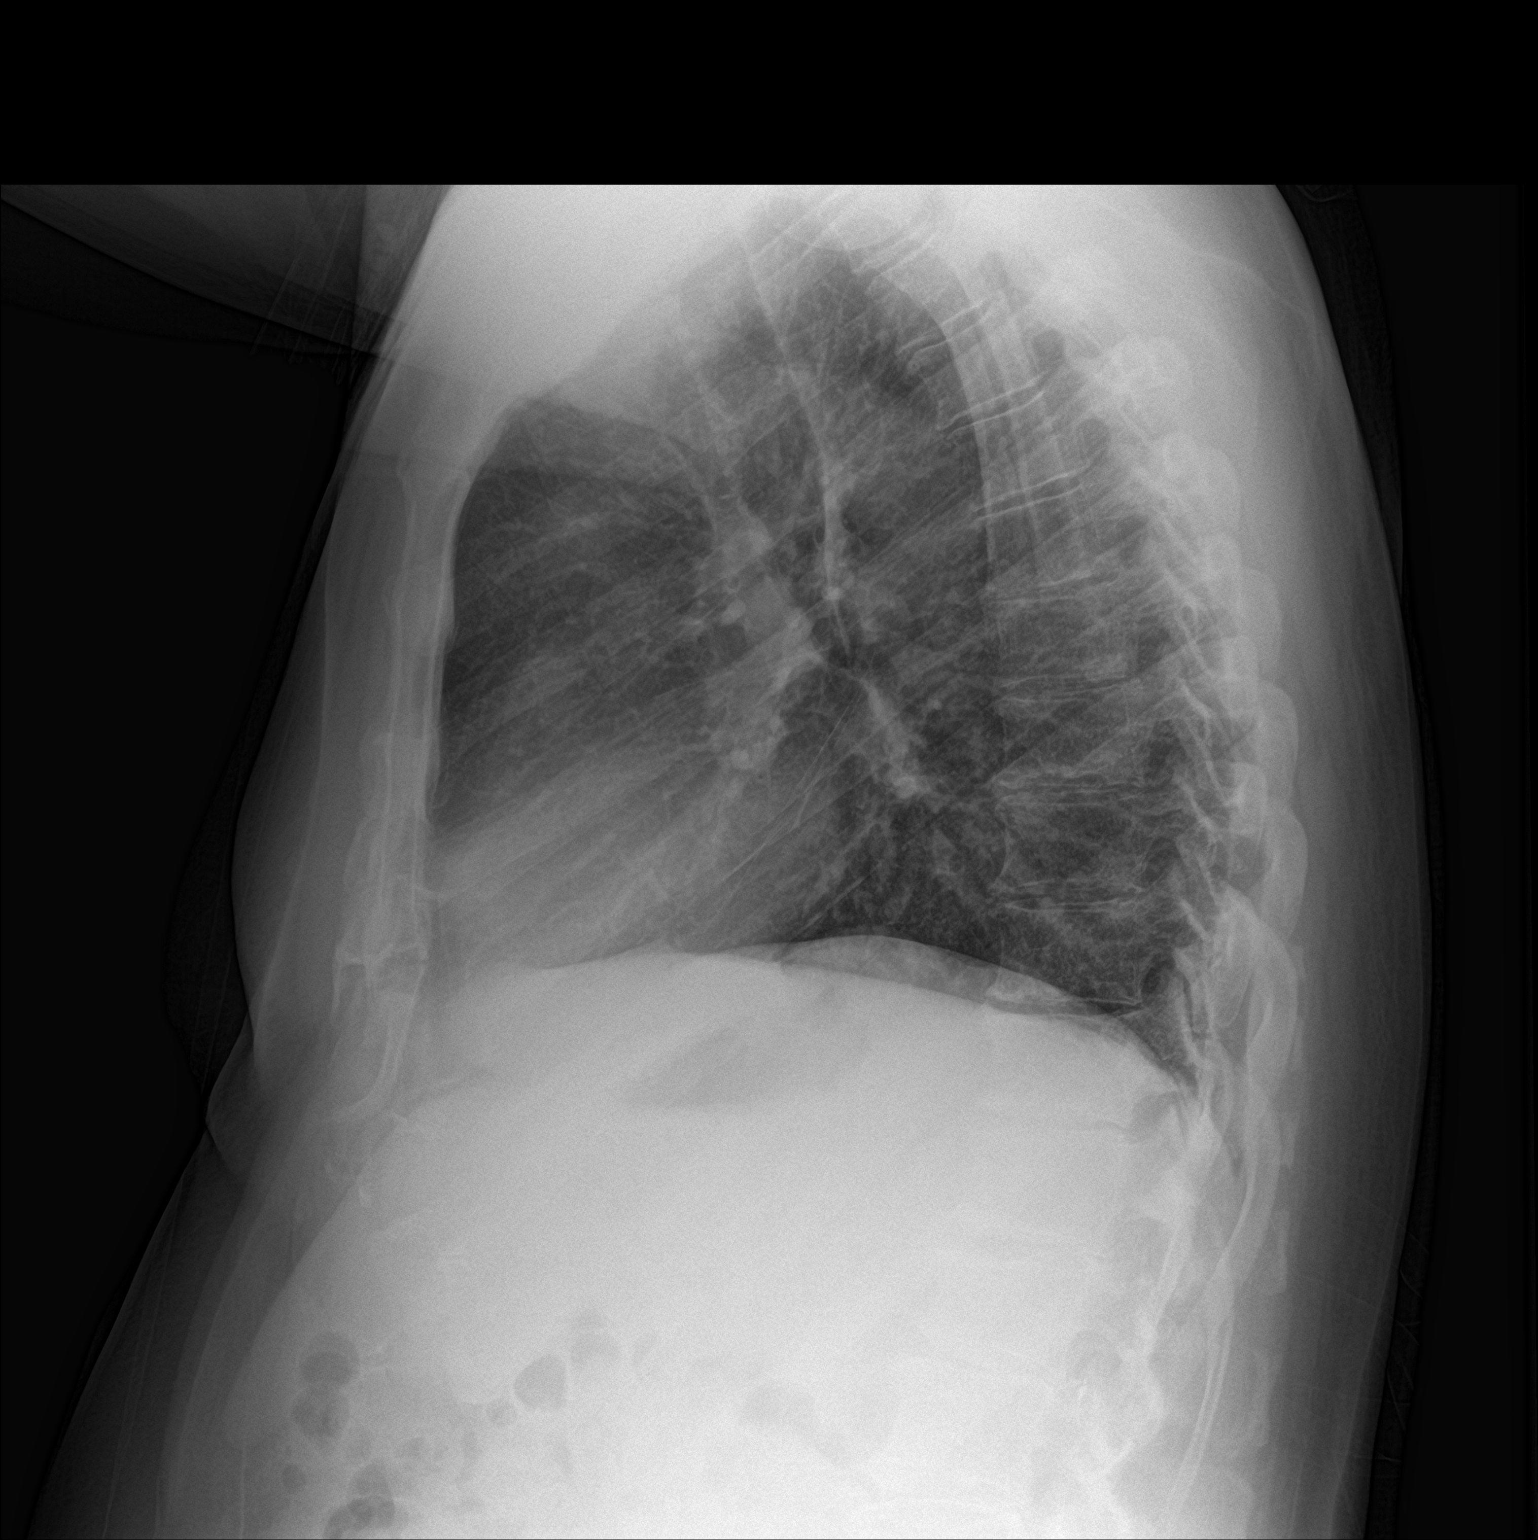

[2 of 2 positions shown; findings below may reference images not displayed]

FINDINGS: The heart size and mediastinal contours are within normal limits.
Both lungs are clear. The visualized skeletal structures are
unremarkable.
IMPRESSION: No active cardiopulmonary disease.

## 2022-10-10 ENCOUNTER — Encounter: Payer: Self-pay | Admitting: Urology

## 2022-10-10 ENCOUNTER — Ambulatory Visit (INDEPENDENT_AMBULATORY_CARE_PROVIDER_SITE_OTHER): Payer: PPO | Admitting: Urology

## 2022-10-10 VITALS — BP 146/89 | HR 65

## 2022-10-10 DIAGNOSIS — R350 Frequency of micturition: Secondary | ICD-10-CM | POA: Diagnosis not present

## 2022-10-10 DIAGNOSIS — N401 Enlarged prostate with lower urinary tract symptoms: Secondary | ICD-10-CM | POA: Diagnosis not present

## 2022-10-10 DIAGNOSIS — R351 Nocturia: Secondary | ICD-10-CM

## 2022-10-10 LAB — POCT URINALYSIS DIPSTICK
Bilirubin, UA: NEGATIVE
Blood, UA: NEGATIVE
Glucose, UA: NEGATIVE
Ketones, UA: NEGATIVE
Leukocytes, UA: NEGATIVE
Nitrite, UA: NEGATIVE
Protein, UA: NEGATIVE
Spec Grav, UA: 1.015 (ref 1.010–1.025)
Urobilinogen, UA: 0.2 E.U./dL
pH, UA: 7 (ref 5.0–8.0)

## 2022-10-10 LAB — BLADDER SCAN AMB NON-IMAGING: Scan Result: 17

## 2022-10-10 MED ORDER — ALFUZOSIN HCL ER 10 MG PO TB24: 10.0000 mg | ORAL_TABLET | Freq: Every evening | ORAL | 11 refills | Status: DC

## 2022-10-10 NOTE — Progress Notes (Signed)
10/10/2022 3:53 PM   Timothy Yates July 10, 1956 [id number]  Referring provider: Encarnacion Slates, PA-C Day [address] [place],  [place] [address]  nocturia   HPI: Mr Marple is a 33AS here for evaluation of nocturia. For the past 3-4 years he has noted a weaker urinary stream and nocturia. He previously took saw palmetto which did improve his LUTS. IPSS 16 QOL 3 on no therapy currently. Nocturia 3-4x.    PMH: Past Medical History:  Diagnosis Date   Medical history non-contributory     Surgical History: Past Surgical History:  Procedure Laterality Date   COLONOSCOPY N/A 02/04/2015   Procedure: COLONOSCOPY;  Surgeon: Corbin Ade, MD;  [facility];  Service: Endoscopy;  Laterality: N/A;  115pm   FRACTURE SURGERY Left    Knee    Home Medications:  Allergies as of 10/10/2022   No Known Allergies      Medication List        Accurate as of October 10, 2022  3:53 PM. If you have any questions, ask your nurse or doctor.          albuterol 108 (90 Base) MCG/ACT inhaler Commonly known as: VENTOLIN HFA Inhale 2 puffs into the lungs every 6 (six) hours as needed for wheezing or shortness of breath.   budesonide-formoterol 80-4.5 MCG/ACT inhaler Commonly known as: Symbicort Take 2 puffs first thing in am and then another 2 puffs about 12 hours later.   L-ARGININE PO Take 2 tablets by mouth every morning.   multivitamin with minerals Tabs tablet Take 1 tablet by mouth daily.   valACYclovir 1000 MG tablet Commonly known as: VALTREX Take 1,000 mg by mouth 3 (three) times daily.        Allergies: No Known Allergies  Family History: Family History  Problem Relation Age of Onset   Swallowing difficulties Mother    Hyperthyroidism Mother    COPD Father    Dementia Father    Thyroid disease Sister    Thyroid disease Brother    Heart disease Brother        Premature   Thyroid disease Sister    Thyroid disease Sister    Thyroid  disease Sister    Colon cancer Neg Hx     Social History:  reports that he has never smoked. He has never used smokeless tobacco. He reports that he does not drink alcohol and does not use drugs.  ROS: All other review of systems were reviewed and are negative except what is noted above in HPI  Physical Exam: BP (!) 146/89   Pulse 65   Constitutional:  Alert and oriented, No acute distress. HEENT: [place] AT, moist mucus membranes.  Trachea midline, no masses. Cardiovascular: No clubbing, cyanosis, or edema. Respiratory: Normal respiratory effort, no increased work of breathing. GI: Abdomen is soft, nontender, nondistended, no abdominal masses GU: No CVA tenderness. Circumcised phallus. No masses/lesions on penis, testis, scrotum. Prostate 30g smooth no nodules no induration.  Lymph: No cervical or inguinal lymphadenopathy. Skin: No rashes, bruises or suspicious lesions. Neurologic: Grossly intact, no focal deficits, moving all 4 extremities. Psychiatric: Normal mood and affect.  Laboratory Data: Lab Results  Component Value Date   WBC 4.9 08/16/2021   HGB 14.2 08/16/2021   HCT 42.0 08/16/2021   MCV 95 08/16/2021   PLT 165 08/16/2021    Lab Results  Component Value Date   CREATININE 0.79 08/16/2021    No results found  for: "PSA"  No results found for: "TESTOSTERONE"  No results found for: "HGBA1C"  Urinalysis    Component Value Date/Time   BILIRUBINUR neg 10/10/2022 1514   PROTEINUR Negative 10/10/2022 1514   UROBILINOGEN 0.2 10/10/2022 1514   NITRITE neg 10/10/2022 1514   LEUKOCYTESUR Negative 10/10/2022 1514    No results found for: "LABMICR", "WBCUA", "RBCUA", "LABEPIT", "MUCUS", "BACTERIA"  Pertinent Imaging:  No results found for this or any previous visit.  No results found for this or any previous visit.  No results found for this or any previous visit.  No results found for this or any previous visit.  No results found for this or any previous  visit.  No valid procedures specified. No results found for this or any previous visit.  No results found for this or any previous visit.   Assessment & Plan:    1. Benign prostatic hyperplasia with urinary frequency -We will trial uroxatral 10mg  qhs - POCT urinalysis dipstick - BLADDER SCAN AMB NON-IMAGING  2. Nocturia -We will trial uroxatral 10mg  qhs   No follow-ups on file.  Nicolette Bang, MD  [facility] [place]

## 2022-10-10 NOTE — Patient Instructions (Signed)

## 2022-10-10 NOTE — Progress Notes (Signed)
post void residual=17 

## 2022-11-14 ENCOUNTER — Ambulatory Visit: Payer: PPO | Admitting: Urology

## 2022-11-14 DIAGNOSIS — N401 Enlarged prostate with lower urinary tract symptoms: Secondary | ICD-10-CM

## 2022-12-13 DIAGNOSIS — R030 Elevated blood-pressure reading, without diagnosis of hypertension: Secondary | ICD-10-CM | POA: Diagnosis not present

## 2022-12-13 DIAGNOSIS — L821 Other seborrheic keratosis: Secondary | ICD-10-CM | POA: Diagnosis not present

## 2022-12-13 DIAGNOSIS — Z6829 Body mass index (BMI) 29.0-29.9, adult: Secondary | ICD-10-CM | POA: Diagnosis not present

## 2023-05-02 DIAGNOSIS — R030 Elevated blood-pressure reading, without diagnosis of hypertension: Secondary | ICD-10-CM | POA: Diagnosis not present

## 2023-05-02 DIAGNOSIS — J449 Chronic obstructive pulmonary disease, unspecified: Secondary | ICD-10-CM | POA: Diagnosis not present

## 2023-05-02 DIAGNOSIS — Z0001 Encounter for general adult medical examination with abnormal findings: Secondary | ICD-10-CM | POA: Diagnosis not present

## 2023-05-02 DIAGNOSIS — Z8249 Family history of ischemic heart disease and other diseases of the circulatory system: Secondary | ICD-10-CM | POA: Diagnosis not present

## 2023-05-02 DIAGNOSIS — Z6828 Body mass index (BMI) 28.0-28.9, adult: Secondary | ICD-10-CM | POA: Diagnosis not present

## 2023-05-02 DIAGNOSIS — F419 Anxiety disorder, unspecified: Secondary | ICD-10-CM | POA: Diagnosis not present

## 2023-05-02 DIAGNOSIS — Z125 Encounter for screening for malignant neoplasm of prostate: Secondary | ICD-10-CM | POA: Diagnosis not present

## 2023-05-02 DIAGNOSIS — Z13220 Encounter for screening for lipoid disorders: Secondary | ICD-10-CM | POA: Diagnosis not present

## 2023-05-05 ENCOUNTER — Encounter: Payer: Self-pay | Admitting: Cardiology

## 2023-05-05 NOTE — Progress Notes (Unsigned)
Cardiology Office Note  Date: 05/08/2023   ID: Roark, Holik 01-Sep-1956, MRN [id number]  PCP: Encarnacion Slates, PA-C  Chief Complaint:  Chief Complaint  Patient presents with   Chest Pain   History of Present Illness: Timothy Yates is a 67 y.o. male referred for cardiology consultation by Mr. Nechama Guard at [facility] for further discussion of cardiac testing in light of family history of CAD.  He was seen in consultation back in 2021 with atypical thoracic discomfort and we did arrange screening with [facility], although the patient ultimately declined testing at that time.  He states that over the last several months he has been experiencing a recurring "sharp" left-sided chest discomfort.  Can last up to 5 minutes at a time, generally sporadic and not necessarily associated with exertion.  He does walk his dog daily, reports NYHA class II dyspnea, no palpitations or syncope.  He has a family history of premature CAD and his younger brother.  ECG today shows normal sinus rhythm.  I do not have recent lipids for review, his LDL was 49 back in 2017.  Review of Systems: As outlined in the history of present illness.  No orthopnea or PND.  No claudication.  Past Medical History: Past Medical History:  Diagnosis Date   Allergic rhinitis    Asthma    Head injury    Childhood   Past Surgical History: Past Surgical History:  Procedure Laterality Date   COLONOSCOPY N/A 02/04/2015   Procedure: COLONOSCOPY;  Surgeon: Corbin Ade, MD;  [facility];  Service: Endoscopy;  Laterality: N/A;  115pm   FRACTURE SURGERY Left    Knee   Family History: Family History  Problem Relation Age of Onset   Swallowing difficulties Mother    Hyperthyroidism Mother    COPD Father    Dementia Father    Thyroid disease Sister    Thyroid disease Brother    Heart disease Brother        Premature   Thyroid disease Sister    Thyroid disease Sister    Thyroid disease Sister    Colon cancer  Neg Hx    Social History:  Social History   Tobacco Use   Smoking status: Never   Smokeless tobacco: Never  Substance Use Topics   Alcohol use: No    Alcohol/week: 0.0 standard drinks of alcohol   Medications: Current Outpatient Medications on File Prior to Visit  Medication Sig Dispense Refill   albuterol (VENTOLIN HFA) 108 (90 Base) MCG/ACT inhaler Inhale 2 puffs into the lungs every 6 (six) hours as needed for wheezing or shortness of breath.     alfuzosin (UROXATRAL) 10 MG 24 hr tablet Take 1 tablet (10 mg total) by mouth at bedtime. 30 tablet 11   budesonide-formoterol (SYMBICORT) 80-4.5 MCG/ACT inhaler Take 2 puffs first thing in am and then another 2 puffs about 12 hours later. 1 each 11   fluticasone (FLONASE) 50 MCG/ACT nasal spray Place 2 sprays into both nostrils as needed.     L-ARGININE PO Take 2 tablets by mouth every morning.     Multiple Vitamin (MULTIVITAMIN WITH MINERALS) TABS tablet Take 1 tablet by mouth daily.     valACYclovir (VALTREX) 1000 MG tablet Take 1,000 mg by mouth 3 (three) times daily. (Patient not taking: Reported on 05/08/2023)     No current facility-administered medications on file prior to visit.   Allergies: No Known Allergies Physical Exam: VS:  BP 132/68  Pulse 69   Ht 6' (1.829 m)   Wt 195 lb 3.2 oz (88.5 kg)   SpO2 99%   BMI 26.47 kg/m , BMI Body mass index is 26.47 kg/m.  Wt Readings from Last 3 Encounters:  05/08/23 195 lb 3.2 oz (88.5 kg)  11/16/21 200 lb (90.7 kg)  08/16/21 196 lb 9.6 oz (89.2 kg)    General: Patient appears comfortable at rest. HEENT: Conjunctiva and lids normal. Neck: Supple, no elevated JVP, soft left carotid bruit. Lungs: Clear to auscultation, nonlabored breathing at rest. Cardiac: Regular rate and rhythm, no S3 or significant systolic murmur, no pericardial rub. Abdomen: Soft, nontender, bowel sounds present. Extremities: No pitting edema, distal pulses 2+. Skin: Warm and dry. Musculoskeletal: No  kyphosis. Neuropsychiatric: Alert and oriented x3, affect grossly appropriate.  ECG:  An ECG dated 04/15/2020 was personally reviewed today and demonstrated:  Sinus bradycardia.  Labwork:  February 2017: Cholesterol 101, triglycerides 52, HDL 42, LDL 49 August 2022: Hemoglobin 14.2, platelets 165, BUN 16, creatinine 0.79, potassium 4.3, BNP 8.1  Other Studies Reviewed Today:  No interval cardiac testing for review today.  Assessment and Plan:  1.  History of recurrent chest pain with mixed features in a 67 year old male with family history of premature CAD, rule out angina pectoris.  ECG is normal today.  Requesting recent lipids from PCP for review.  Plan to proceed with a coronary CTA for calcium score and assessment of coronary anatomy.  2.  Soft left carotid bruit, check carotid Dopplers.  3.  History of asthma, currently stable.  He is on Ventolin and Symbicort.  Disposition:  Follow up  test results.  Signed, Jonelle Sidle, M.D., F.A.C.C. [facility] at [facility]

## 2023-05-08 ENCOUNTER — Encounter: Payer: Self-pay | Admitting: *Deleted

## 2023-05-08 ENCOUNTER — Ambulatory Visit: Payer: PPO | Attending: Cardiology | Admitting: Cardiology

## 2023-05-08 ENCOUNTER — Encounter: Payer: Self-pay | Admitting: Cardiology

## 2023-05-08 VITALS — BP 132/68 | HR 69 | Ht 72.0 in | Wt 195.2 lb

## 2023-05-08 DIAGNOSIS — Z8249 Family history of ischemic heart disease and other diseases of the circulatory system: Secondary | ICD-10-CM

## 2023-05-08 DIAGNOSIS — I209 Angina pectoris, unspecified: Secondary | ICD-10-CM | POA: Diagnosis not present

## 2023-05-08 DIAGNOSIS — R0989 Other specified symptoms and signs involving the circulatory and respiratory systems: Secondary | ICD-10-CM | POA: Diagnosis not present

## 2023-05-08 MED ORDER — METOPROLOL TARTRATE 100 MG PO TABS: 100.0000 mg | ORAL_TABLET | Freq: Once | ORAL | 0 refills | Status: AC

## 2023-05-08 NOTE — Patient Instructions (Addendum)
Medication Instructions:  Your physician recommends that you continue on your current medications as directed. Please refer to the Current Medication list given to you today.  Labwork: BMET in 1-2 weeks before CT scan Non-fasting [employer] or [facility] ([address] [place])  Testing/Procedures: Your physician has requested that you have a carotid duplex. This test is an ultrasound of the carotid arteries in your neck. It looks at blood flow through these arteries that supply the brain with blood. Allow one hour for this exam. There are no restrictions or special instructions. Coronary CTA-see instructions below  Follow-Up: Your physician recommends that you schedule a follow-up appointment in: pending  Any Other Special Instructions Will Be Listed Below (If Applicable).  If you need a refill on your cardiac medications before your next appointment, please call your pharmacy.    Your cardiac CT will be scheduled at one of the below locations:   [facility] [address] [place], [place] [address] [phone]  If scheduled at [facility], please arrive at the [facility] and Children's Entrance (Entrance C2) of [facility] 30 minutes prior to test start time. You can use the FREE valet parking offered at entrance C (encouraged to control the heart rate for the test)  Proceed to the [facility] Radiology Department (first floor) to check-in and test prep.  All radiology patients and guests should use entrance C2 at [facility], accessed from [facility], even though the hospital's physical address listed is [address].    Please follow these instructions carefully (unless otherwise directed):  Hold all erectile dysfunction medications at least 3 days (72 hrs) prior to test. (Ie viagra, cialis, sildenafil, tadalafil, etc) We will administer nitroglycerin during this exam.   On the Night Before the Test: Be  sure to Drink plenty of water. Do not consume any caffeinated/decaffeinated beverages or chocolate 12 hours prior to your test. Do not take any antihistamines 12 hours prior to your test. If the patient has contrast allergy: No contrast allergy  On the Day of the Test: Drink plenty of water until 1 hour prior to the test. Do not eat any food 1 hour prior to test. You may take your regular medications prior to the test.  Take metoprolol (Lopressor) 100 mg two hours prior to test.      After the Test: Drink plenty of water. After receiving IV contrast, you may experience a mild flushed feeling. This is normal. On occasion, you may experience a mild rash up to 24 hours after the test. This is not dangerous. If this occurs, you can take Benadryl 25 mg and increase your fluid intake. If you experience trouble breathing, this can be serious. If it is severe call 911 IMMEDIATELY. If it is mild, please call our office.  We will call to schedule your test 2-4 weeks out understanding that some insurance companies will need an authorization prior to the service being performed.   For non-scheduling related questions, please contact the cardiac imaging nurse navigator should you have any questions/concerns: Rockwell Alexandria, Cardiac Imaging Nurse Navigator Larey Brick, Cardiac Imaging Nurse Navigator [facility] Direct Office Dial: [phone]   For scheduling needs, including cancellations and rescheduling, please call [place], [phone].

## 2023-05-08 NOTE — Addendum Note (Signed)
Addended by: Eustace Moore on: 05/08/2023 09:52 AM   Modules accepted: Orders

## 2023-05-19 ENCOUNTER — Ambulatory Visit (HOSPITAL_COMMUNITY)
Admission: RE | Admit: 2023-05-19 | Discharge: 2023-05-19 | Disposition: A | Discharge status: Home / Self Care | Payer: PPO | Source: Ambulatory Visit | Attending: Cardiology | Admitting: Cardiology

## 2023-05-19 DIAGNOSIS — R0989 Other specified symptoms and signs involving the circulatory and respiratory systems: Secondary | ICD-10-CM | POA: Insufficient documentation

## 2023-05-19 DIAGNOSIS — I6523 Occlusion and stenosis of bilateral carotid arteries: Secondary | ICD-10-CM | POA: Diagnosis not present

## 2023-07-06 ENCOUNTER — Encounter (HOSPITAL_COMMUNITY): Payer: Self-pay

## 2023-07-12 ENCOUNTER — Telehealth (HOSPITAL_COMMUNITY): Payer: Self-pay | Admitting: Emergency Medicine

## 2023-07-12 DIAGNOSIS — I209 Angina pectoris, unspecified: Secondary | ICD-10-CM | POA: Diagnosis not present

## 2023-07-12 DIAGNOSIS — Z8249 Family history of ischemic heart disease and other diseases of the circulatory system: Secondary | ICD-10-CM | POA: Diagnosis not present

## 2023-07-12 NOTE — Telephone Encounter (Signed)
Attempted to call patient regarding upcoming cardiac CT appointment. °Left message on voicemail with name and callback number °Sara Wallace RN Navigator Cardiac Imaging °[facility] °[phone] Office °[phone] Cell ° °

## 2023-07-13 LAB — BASIC METABOLIC PANEL
BUN: 19 mg/dL (ref 8–27)
CO2: 26 mmol/L (ref 20–29)
Calcium: 9.6 mg/dL (ref 8.6–10.2)
Chloride: 102 mmol/L (ref 96–106)
Glucose: 89 mg/dL (ref 70–99)
Potassium: 4.5 mmol/L (ref 3.5–5.2)
Sodium: 140 mmol/L (ref 134–144)

## 2023-07-14 ENCOUNTER — Ambulatory Visit (HOSPITAL_COMMUNITY)
Admission: RE | Admit: 2023-07-14 | Discharge: 2023-07-14 | Disposition: A | Discharge status: Home / Self Care | Payer: PPO | Source: Ambulatory Visit | Attending: Cardiology | Admitting: Cardiology

## 2023-07-14 DIAGNOSIS — I25119 Atherosclerotic heart disease of native coronary artery with unspecified angina pectoris: Secondary | ICD-10-CM | POA: Diagnosis not present

## 2023-07-14 DIAGNOSIS — I209 Angina pectoris, unspecified: Secondary | ICD-10-CM

## 2023-07-14 DIAGNOSIS — Z8249 Family history of ischemic heart disease and other diseases of the circulatory system: Secondary | ICD-10-CM

## 2023-07-14 MED ORDER — IOHEXOL 350 MG/ML SOLN
95.0000 mL | Freq: Once | INTRAVENOUS | Status: AC | PRN
  Administered 2023-07-14: 95 mL via INTRAVENOUS

## 2023-07-14 MED ORDER — NITROGLYCERIN 0.4 MG SL SUBL
0.8000 mg | SUBLINGUAL_TABLET | Freq: Once | SUBLINGUAL | Status: AC
  Administered 2023-07-14: 0.8 mg via SUBLINGUAL

## 2023-07-14 MED ORDER — NITROGLYCERIN 0.4 MG SL SUBL
SUBLINGUAL_TABLET | SUBLINGUAL | Status: AC
  Filled 2023-07-14: qty 2

## 2023-07-17 ENCOUNTER — Other Ambulatory Visit: Payer: Self-pay | Admitting: *Deleted

## 2023-07-17 ENCOUNTER — Telehealth: Payer: Self-pay | Admitting: *Deleted

## 2023-07-17 DIAGNOSIS — Z8249 Family history of ischemic heart disease and other diseases of the circulatory system: Secondary | ICD-10-CM

## 2023-07-17 DIAGNOSIS — Z13220 Encounter for screening for lipoid disorders: Secondary | ICD-10-CM

## 2023-07-17 NOTE — Telephone Encounter (Signed)
Patient informed and verbalized understanding of plan. Copy sent to PCP Lab order set for [facility]

## 2023-07-17 NOTE — Addendum Note (Signed)
Addended by: Eustace Moore on: 07/17/2023 12:17 PM   Modules accepted: Orders

## 2023-07-17 NOTE — Telephone Encounter (Signed)
-----   Message from Nona Dell sent at 07/16/2023  9:15 PM EDT ----- Results reviewed. Coronary calcium score is 51 (low risk) and has has nonobstructive coronary atherosclerosis which would not necessarily be expected to cause angina. Suggest he get a follow-up fasting lipid profile if not done recently. Ideally would initiate statin therapy in the setting of atherosclerosis as well.

## 2023-09-19 DIAGNOSIS — N451 Epididymitis: Secondary | ICD-10-CM | POA: Diagnosis not present

## 2023-09-19 DIAGNOSIS — N4889 Other specified disorders of penis: Secondary | ICD-10-CM | POA: Diagnosis not present

## 2023-09-19 DIAGNOSIS — R030 Elevated blood-pressure reading, without diagnosis of hypertension: Secondary | ICD-10-CM | POA: Diagnosis not present

## 2023-09-19 DIAGNOSIS — Z6827 Body mass index (BMI) 27.0-27.9, adult: Secondary | ICD-10-CM | POA: Diagnosis not present

## 2023-10-27 ENCOUNTER — Ambulatory Visit: Payer: PPO | Admitting: Urology

## 2023-10-27 VITALS — BP 149/72 | HR 67

## 2023-10-27 DIAGNOSIS — N529 Male erectile dysfunction, unspecified: Secondary | ICD-10-CM

## 2023-10-27 DIAGNOSIS — N486 Induration penis plastica: Secondary | ICD-10-CM

## 2023-10-27 DIAGNOSIS — R350 Frequency of micturition: Secondary | ICD-10-CM | POA: Diagnosis not present

## 2023-10-27 DIAGNOSIS — N401 Enlarged prostate with lower urinary tract symptoms: Secondary | ICD-10-CM

## 2023-10-27 DIAGNOSIS — R351 Nocturia: Secondary | ICD-10-CM

## 2023-10-27 LAB — URINALYSIS, ROUTINE W REFLEX MICROSCOPIC
Bilirubin, UA: NEGATIVE
Glucose, UA: NEGATIVE
Ketones, UA: NEGATIVE
Leukocytes,UA: NEGATIVE
Nitrite, UA: NEGATIVE
Protein,UA: NEGATIVE
RBC, UA: NEGATIVE
Specific Gravity, UA: 1.01 (ref 1.005–1.030)
Urobilinogen, Ur: 0.2 mg/dL (ref 0.2–1.0)
pH, UA: 7.5 (ref 5.0–7.5)

## 2023-10-27 MED ORDER — ALFUZOSIN HCL ER 10 MG PO TB24: 10.0000 mg | ORAL_TABLET | Freq: Every evening | ORAL | 11 refills | Status: DC

## 2023-10-27 MED ORDER — TADALAFIL 20 MG PO TABS: 20.0000 mg | ORAL_TABLET | ORAL | 5 refills | Status: DC | PRN

## 2023-10-27 NOTE — Progress Notes (Unsigned)
10/27/2023 11:30 AM   Timothy Yates 1956/11/19 [id number]  Referring provider: Encarnacion Slates, PA-C Day [address] [place],  [place] [address]  Penile curvature   HPI:  Timothy Yates is a 67yo here for followup for BPH and for evaluation of penile curvature. 5 months ago he took cialis and noted ventral penile curvature. No pain with erection. He has taken cialis 10mg  prn and he has decrease rigidity of his erection. IPSS 12 QOl 2 on uroxatral 10mg  at bedtime. Urine stream strong. No straining to urinate. Nocturia 2x. No other complaints today  PMH: Past Medical History:  Diagnosis Date   Allergic rhinitis    Asthma    Head injury    Childhood    Surgical History: Past Surgical History:  Procedure Laterality Date   COLONOSCOPY N/A 02/04/2015   Procedure: COLONOSCOPY;  Surgeon: Corbin Ade, MD;  [facility];  Service: Endoscopy;  Laterality: N/A;  115pm   FRACTURE SURGERY Left    Knee    Home Medications:  Allergies as of 10/27/2023   No Known Allergies      Medication List        Accurate as of October 27, 2023 11:30 AM. If you have any questions, ask your nurse or doctor.          albuterol 108 (90 Base) MCG/ACT inhaler Commonly known as: VENTOLIN HFA Inhale 2 puffs into the lungs every 6 (six) hours as needed for wheezing or shortness of breath.   alfuzosin 10 MG 24 hr tablet Commonly known as: UROXATRAL Take 1 tablet (10 mg total) by mouth at bedtime.   budesonide-formoterol 80-4.5 MCG/ACT inhaler Commonly known as: Symbicort Take 2 puffs first thing in am and then another 2 puffs about 12 hours later.   fluticasone 50 MCG/ACT nasal spray Commonly known as: FLONASE Place 2 sprays into both nostrils as needed.   L-ARGININE PO Take 2 tablets by mouth every morning.   metoprolol tartrate 100 MG tablet Commonly known as: LOPRESSOR Take 1 tablet (100 mg total) by mouth once for 1 dose. 2 hours before CT scan   multivitamin  with minerals Tabs tablet Take 1 tablet by mouth daily.   valACYclovir 1000 MG tablet Commonly known as: VALTREX Take 1,000 mg by mouth 3 (three) times daily.        Allergies: No Known Allergies  Family History: Family History  Problem Relation Age of Onset   Swallowing difficulties Mother    Hyperthyroidism Mother    COPD Father    Dementia Father    Thyroid disease Sister    Thyroid disease Brother    Heart disease Brother        Premature   Thyroid disease Sister    Thyroid disease Sister    Thyroid disease Sister    Colon cancer Neg Hx     Social History:  reports that he has never smoked. He has never used smokeless tobacco. He reports that he does not drink alcohol and does not use drugs.  ROS: All other review of systems were reviewed and are negative except what is noted above in HPI  Physical Exam: BP (!) 149/72   Pulse 67   Constitutional:  Alert and oriented, No acute distress. HEENT: [place] AT, moist mucus membranes.  Trachea midline, no masses. Cardiovascular: No clubbing, cyanosis, or edema. Respiratory: Normal respiratory effort, no increased work of breathing. GI: Abdomen is soft, nontender, nondistended, no abdominal masses  GU: No CVA tenderness.  Lymph: No cervical or inguinal lymphadenopathy. Skin: No rashes, bruises or suspicious lesions. Neurologic: Grossly intact, no focal deficits, moving all 4 extremities. Psychiatric: Normal mood and affect.  Laboratory Data: Lab Results  Component Value Date   WBC 4.9 08/16/2021   HGB 14.2 08/16/2021   HCT 42.0 08/16/2021   MCV 95 08/16/2021   PLT 165 08/16/2021    Lab Results  Component Value Date   CREATININE 0.84 07/12/2023    No results found for: "PSA"  No results found for: "TESTOSTERONE"  No results found for: "HGBA1C"  Urinalysis    Component Value Date/Time   BILIRUBINUR neg 10/10/2022 1514   PROTEINUR Negative 10/10/2022 1514   UROBILINOGEN 0.2 10/10/2022 1514   NITRITE neg  10/10/2022 1514   LEUKOCYTESUR Negative 10/10/2022 1514    No results found for: "LABMICR", "WBCUA", "RBCUA", "LABEPIT", "MUCUS", "BACTERIA"  Pertinent Imaging:  No results found for this or any previous visit.  No results found for this or any previous visit.  No results found for this or any previous visit.  No results found for this or any previous visit.  No results found for this or any previous visit.  No valid procedures specified. No results found for this or any previous visit.  No results found for this or any previous visit.   Assessment & Plan:    1. Benign prostatic hyperplasia with urinary frequency Uroxatral 10mg  qhs - Urinalysis, Routine w reflex microscopic  2. Nocturia Uroxatral 10mg  qhs  3. Erectile dysfunction -tadalafil 20mg  prn   No follow-ups on file.  Timothy Aye, MD  [facility] [place]

## 2023-10-29 ENCOUNTER — Encounter: Payer: Self-pay | Admitting: Urology

## 2024-03-11 DIAGNOSIS — S46012D Strain of muscle(s) and tendon(s) of the rotator cuff of left shoulder, subsequent encounter: Secondary | ICD-10-CM | POA: Diagnosis not present

## 2024-03-11 DIAGNOSIS — S46212D Strain of muscle, fascia and tendon of other parts of biceps, left arm, subsequent encounter: Secondary | ICD-10-CM | POA: Diagnosis not present

## 2024-03-15 DIAGNOSIS — M25512 Pain in left shoulder: Secondary | ICD-10-CM | POA: Diagnosis not present

## 2024-03-28 DIAGNOSIS — M25512 Pain in left shoulder: Secondary | ICD-10-CM | POA: Diagnosis not present

## 2024-04-08 DIAGNOSIS — M19012 Primary osteoarthritis, left shoulder: Secondary | ICD-10-CM | POA: Diagnosis not present

## 2024-04-08 DIAGNOSIS — X58XXXA Exposure to other specified factors, initial encounter: Secondary | ICD-10-CM | POA: Diagnosis not present

## 2024-04-08 DIAGNOSIS — S46112A Strain of muscle, fascia and tendon of long head of biceps, left arm, initial encounter: Secondary | ICD-10-CM | POA: Diagnosis not present

## 2024-04-08 DIAGNOSIS — S46012A Strain of muscle(s) and tendon(s) of the rotator cuff of left shoulder, initial encounter: Secondary | ICD-10-CM | POA: Diagnosis not present

## 2024-04-08 DIAGNOSIS — M75122 Complete rotator cuff tear or rupture of left shoulder, not specified as traumatic: Secondary | ICD-10-CM | POA: Diagnosis not present

## 2024-04-08 DIAGNOSIS — M7522 Bicipital tendinitis, left shoulder: Secondary | ICD-10-CM | POA: Diagnosis not present

## 2024-04-08 DIAGNOSIS — M7542 Impingement syndrome of left shoulder: Secondary | ICD-10-CM | POA: Diagnosis not present

## 2024-04-08 DIAGNOSIS — M7582 Other shoulder lesions, left shoulder: Secondary | ICD-10-CM | POA: Diagnosis not present

## 2024-04-08 DIAGNOSIS — G8918 Other acute postprocedural pain: Secondary | ICD-10-CM | POA: Diagnosis not present

## 2024-04-08 DIAGNOSIS — M24112 Other articular cartilage disorders, left shoulder: Secondary | ICD-10-CM | POA: Diagnosis not present

## 2024-04-08 DIAGNOSIS — M25812 Other specified joint disorders, left shoulder: Secondary | ICD-10-CM | POA: Diagnosis not present

## 2024-04-08 DIAGNOSIS — Y999 Unspecified external cause status: Secondary | ICD-10-CM | POA: Diagnosis not present

## 2024-04-16 DIAGNOSIS — M19012 Primary osteoarthritis, left shoulder: Secondary | ICD-10-CM | POA: Diagnosis not present

## 2024-04-25 DIAGNOSIS — M25512 Pain in left shoulder: Secondary | ICD-10-CM | POA: Diagnosis not present

## 2024-04-26 ENCOUNTER — Ambulatory Visit: Payer: PPO | Admitting: Urology

## 2024-04-26 ENCOUNTER — Encounter: Payer: Self-pay | Admitting: Urology

## 2024-04-26 VITALS — BP 133/64 | HR 71

## 2024-04-26 DIAGNOSIS — N401 Enlarged prostate with lower urinary tract symptoms: Secondary | ICD-10-CM | POA: Diagnosis not present

## 2024-04-26 DIAGNOSIS — N5201 Erectile dysfunction due to arterial insufficiency: Secondary | ICD-10-CM

## 2024-04-26 DIAGNOSIS — R350 Frequency of micturition: Secondary | ICD-10-CM | POA: Diagnosis not present

## 2024-04-26 DIAGNOSIS — R351 Nocturia: Secondary | ICD-10-CM | POA: Diagnosis not present

## 2024-04-26 LAB — URINALYSIS, ROUTINE W REFLEX MICROSCOPIC
Bilirubin, UA: NEGATIVE
Glucose, UA: NEGATIVE
Ketones, UA: NEGATIVE
Leukocytes,UA: NEGATIVE
Nitrite, UA: NEGATIVE
Protein,UA: NEGATIVE
RBC, UA: NEGATIVE
Specific Gravity, UA: 1.02 (ref 1.005–1.030)
Urobilinogen, Ur: 0.2 mg/dL (ref 0.2–1.0)
pH, UA: 7 (ref 5.0–7.5)

## 2024-04-26 LAB — BLADDER SCAN AMB NON-IMAGING: Scan Result: 10

## 2024-04-26 MED ORDER — SILDENAFIL CITRATE 100 MG PO TABS: 100.0000 mg | ORAL_TABLET | Freq: Every day | ORAL | 3 refills | Status: DC | PRN

## 2024-04-26 MED ORDER — ALFUZOSIN HCL ER 10 MG PO TB24: 10.0000 mg | ORAL_TABLET | Freq: Two times a day (BID) | ORAL | 11 refills | Status: DC

## 2024-04-26 NOTE — Patient Instructions (Signed)

## 2024-04-26 NOTE — Progress Notes (Signed)
post void residual=6 

## 2024-04-26 NOTE — Progress Notes (Signed)
 04/26/2024 11:06 AM   Timothy Yates 1956/08/18 [id number]  Referring provider: Anthonette Bastos, PA-C [facility] [address] [place],  [place] [address]  Urinary frequency   HPI: Mr Mckechnie is a 68yo here for BPH and erectile dysfunction. IPSS 12 QOl 3 on uroxatral  10mg  at bedtime. He has nocturia 2-3x and urinary frequency every 1-2 hours. He drinks 1-2 cups of coffee after 3pm. Urine stream fair. No straining to urinate. He uses tadalafil  20mg  with poor results   PMH: Past Medical History:  Diagnosis Date   Allergic rhinitis    Asthma    Head injury    Childhood    Surgical History: Past Surgical History:  Procedure Laterality Date   COLONOSCOPY N/A 02/04/2015   Procedure: COLONOSCOPY;  Surgeon: Suzette Espy, MD;  [facility];  Service: Endoscopy;  Laterality: N/A;  115pm   FRACTURE SURGERY Left    Knee    Home Medications:  Allergies as of 04/26/2024   No Known Allergies      Medication List        Accurate as of Apr 26, 2024 11:06 AM. If you have any questions, ask your nurse or doctor.          albuterol  108 (90 Base) MCG/ACT inhaler Commonly known as: VENTOLIN  HFA Inhale 2 puffs into the lungs every 6 (six) hours as needed for wheezing or shortness of breath.   alfuzosin  10 MG 24 hr tablet Commonly known as: UROXATRAL  Take 1 tablet (10 mg total) by mouth at bedtime.   budesonide -formoterol  80-4.5 MCG/ACT inhaler Commonly known as: Symbicort  Take 2 puffs first thing in am and then another 2 puffs about 12 hours later.   fluticasone 50 MCG/ACT nasal spray Commonly known as: FLONASE Place 2 sprays into both nostrils as needed.   L-ARGININE PO Take 2 tablets by mouth every morning.   metoprolol  tartrate 100 MG tablet Commonly known as: LOPRESSOR  Take 1 tablet (100 mg total) by mouth once for 1 dose. 2 hours before CT scan   multivitamin with minerals Tabs tablet Take 1 tablet by mouth daily.   tadalafil  20 MG tablet Commonly  known as: CIALIS  Take 1 tablet (20 mg total) by mouth as needed.   valACYclovir 1000 MG tablet Commonly known as: VALTREX Take 1,000 mg by mouth 3 (three) times daily.        Allergies: No Known Allergies  Family History: Family History  Problem Relation Age of Onset   Swallowing difficulties Mother    Hyperthyroidism Mother    COPD Father    Dementia Father    Thyroid  disease Sister    Thyroid  disease Brother    Heart disease Brother        Premature   Thyroid  disease Sister    Thyroid  disease Sister    Thyroid  disease Sister    Colon cancer Neg Hx     Social History:  reports that he has never smoked. He has never used smokeless tobacco. He reports that he does not drink alcohol  and does not use drugs.  ROS: All other review of systems were reviewed and are negative except what is noted above in HPI  Physical Exam: BP 133/64   Pulse 71   Constitutional:  Alert and oriented, No acute distress. HEENT: [place] AT, moist mucus membranes.  Trachea midline, no masses. Cardiovascular: No clubbing, cyanosis, or edema. Respiratory: Normal respiratory effort, no increased work of breathing. GI: Abdomen is soft, nontender,  nondistended, no abdominal masses GU: No CVA tenderness.  Lymph: No cervical or inguinal lymphadenopathy. Skin: No rashes, bruises or suspicious lesions. Neurologic: Grossly intact, no focal deficits, moving all 4 extremities. Psychiatric: Normal mood and affect.  Laboratory Data: Lab Results  Component Value Date   WBC 4.9 08/16/2021   HGB 14.2 08/16/2021   HCT 42.0 08/16/2021   MCV 95 08/16/2021   PLT 165 08/16/2021    Lab Results  Component Value Date   CREATININE 0.84 07/12/2023    No results found for: "PSA"  No results found for: "TESTOSTERONE"  No results found for: "HGBA1C"  Urinalysis    Component Value Date/Time   APPEARANCEUR Clear 10/27/2023 1102   GLUCOSEU Negative 10/27/2023 1102   BILIRUBINUR Negative 10/27/2023 1102    PROTEINUR Negative 10/27/2023 1102   UROBILINOGEN 0.2 10/10/2022 1514   NITRITE Negative 10/27/2023 1102   LEUKOCYTESUR Negative 10/27/2023 1102    Lab Results  Component Value Date   LABMICR Comment 10/27/2023    Pertinent Imaging:  No results found for this or any previous visit.  No results found for this or any previous visit.  No results found for this or any previous visit.  No results found for this or any previous visit.  No results found for this or any previous visit.  No results found for this or any previous visit.  No results found for this or any previous visit.  No results found for this or any previous visit.   Assessment & Plan:    1. Benign prostatic hyperplasia with urinary frequency (Primary) Increase uroxatral  to 10mg  BID - Urinalysis, Routine w reflex microscopic - BLADDER SCAN AMB NON-IMAGING  2. Nocturia -uroxatral  10mg  BID  3. Erectile dysfunction due to arterial insufficiency We will trial sildenafil 100mg  prn   No follow-ups on file.  Johnie Nailer, MD  [facility] [place]

## 2024-04-29 DIAGNOSIS — M25512 Pain in left shoulder: Secondary | ICD-10-CM | POA: Diagnosis not present

## 2024-05-01 DIAGNOSIS — M25512 Pain in left shoulder: Secondary | ICD-10-CM | POA: Diagnosis not present

## 2024-05-06 DIAGNOSIS — M25512 Pain in left shoulder: Secondary | ICD-10-CM | POA: Diagnosis not present

## 2024-05-08 DIAGNOSIS — M25512 Pain in left shoulder: Secondary | ICD-10-CM | POA: Diagnosis not present

## 2024-05-14 DIAGNOSIS — M25512 Pain in left shoulder: Secondary | ICD-10-CM | POA: Diagnosis not present

## 2024-05-16 DIAGNOSIS — M25512 Pain in left shoulder: Secondary | ICD-10-CM | POA: Diagnosis not present

## 2024-05-20 DIAGNOSIS — M25512 Pain in left shoulder: Secondary | ICD-10-CM | POA: Diagnosis not present

## 2024-05-22 DIAGNOSIS — M25512 Pain in left shoulder: Secondary | ICD-10-CM | POA: Diagnosis not present

## 2024-05-27 DIAGNOSIS — M25512 Pain in left shoulder: Secondary | ICD-10-CM | POA: Diagnosis not present

## 2024-05-29 DIAGNOSIS — M25512 Pain in left shoulder: Secondary | ICD-10-CM | POA: Diagnosis not present

## 2024-06-04 DIAGNOSIS — M25512 Pain in left shoulder: Secondary | ICD-10-CM | POA: Diagnosis not present

## 2024-06-05 DIAGNOSIS — M25512 Pain in left shoulder: Secondary | ICD-10-CM | POA: Diagnosis not present

## 2024-06-11 DIAGNOSIS — M25512 Pain in left shoulder: Secondary | ICD-10-CM | POA: Diagnosis not present

## 2024-06-13 DIAGNOSIS — M25512 Pain in left shoulder: Secondary | ICD-10-CM | POA: Diagnosis not present

## 2024-06-18 DIAGNOSIS — M25512 Pain in left shoulder: Secondary | ICD-10-CM | POA: Diagnosis not present

## 2024-06-20 DIAGNOSIS — M25512 Pain in left shoulder: Secondary | ICD-10-CM | POA: Diagnosis not present

## 2024-07-18 DIAGNOSIS — H40033 Anatomical narrow angle, bilateral: Secondary | ICD-10-CM | POA: Diagnosis not present

## 2024-07-18 DIAGNOSIS — H2513 Age-related nuclear cataract, bilateral: Secondary | ICD-10-CM | POA: Diagnosis not present

## 2024-10-07 DIAGNOSIS — Z6827 Body mass index (BMI) 27.0-27.9, adult: Secondary | ICD-10-CM | POA: Diagnosis not present

## 2024-10-07 DIAGNOSIS — L918 Other hypertrophic disorders of the skin: Secondary | ICD-10-CM | POA: Diagnosis not present

## 2024-10-28 ENCOUNTER — Ambulatory Visit: Admitting: Urology

## 2024-10-28 ENCOUNTER — Encounter: Payer: Self-pay | Admitting: Urology

## 2024-10-28 VITALS — BP 138/69 | HR 65

## 2024-10-28 DIAGNOSIS — N401 Enlarged prostate with lower urinary tract symptoms: Secondary | ICD-10-CM | POA: Diagnosis not present

## 2024-10-28 DIAGNOSIS — N5201 Erectile dysfunction due to arterial insufficiency: Secondary | ICD-10-CM

## 2024-10-28 DIAGNOSIS — R350 Frequency of micturition: Secondary | ICD-10-CM

## 2024-10-28 DIAGNOSIS — R351 Nocturia: Secondary | ICD-10-CM | POA: Diagnosis not present

## 2024-10-28 LAB — URINALYSIS, ROUTINE W REFLEX MICROSCOPIC
Bilirubin, UA: NEGATIVE
Glucose, UA: NEGATIVE
Ketones, UA: NEGATIVE
Leukocytes,UA: NEGATIVE
Nitrite, UA: NEGATIVE
Protein,UA: NEGATIVE
RBC, UA: NEGATIVE
Specific Gravity, UA: <1.005 — ABNORMAL LOW (ref 1.005–1.030)
Urobilinogen, Ur: 0.2 mg/dL (ref 0.2–1.0)
pH, UA: 6 (ref 5.0–7.5)

## 2024-10-28 LAB — BLADDER SCAN AMB NON-IMAGING: Scan Result: 35

## 2024-10-28 MED ORDER — SILDENAFIL CITRATE 100 MG PO TABS: 100.0000 mg | ORAL_TABLET | Freq: Every day | ORAL | 3 refills | Status: AC | PRN

## 2024-10-28 MED ORDER — ALFUZOSIN HCL ER 10 MG PO TB24: 10.0000 mg | ORAL_TABLET | Freq: Every day | ORAL | 3 refills | Status: AC

## 2024-10-28 NOTE — Progress Notes (Signed)
   Patient can void prior to the bladder scan. Bladder scan result: 35  Performed By: [facility] LPN

## 2024-10-28 NOTE — Progress Notes (Signed)
 10/28/2024 10:47 AM   Timothy Yates Fire 07-24-1956 [id number]  Referring provider: Jolee Greig DEL, PA-C [facility] [address],  [place] [address]  Followup BPH and erectile dysfunction  HPI: Timothy Yates is a 68yo here for followup for BPh and erectile dysfunction. IPSS 12 QOL 3 on uroxatral  10mg  at bedtime. Urine stream strong. Nocturia 1-2x. He continues to have issues getting an erection and takes sildenafil  100mg  prn with good results.    PMH: Past Medical History:  Diagnosis Date   Allergic rhinitis    Asthma    Head injury    Childhood    Surgical History: Past Surgical History:  Procedure Laterality Date   COLONOSCOPY N/A 02/04/2015   Procedure: COLONOSCOPY;  Surgeon: Lamar CHRISTELLA Hollingshead, MD;  [facility];  Service: Endoscopy;  Laterality: N/A;  115pm   FRACTURE SURGERY Left    Knee    Home Medications:  Allergies as of 10/28/2024   No Known Allergies      Medication List        Accurate as of October 28, 2024 10:47 AM. If you have any questions, ask your nurse or doctor.          albuterol  108 (90 Base) MCG/ACT inhaler Commonly known as: VENTOLIN  HFA Inhale 2 puffs into the lungs every 6 (six) hours as needed for wheezing or shortness of breath.   alfuzosin  10 MG 24 hr tablet Commonly known as: UROXATRAL  Take 1 tablet (10 mg total) by mouth in the morning and at bedtime.   budesonide -formoterol  80-4.5 MCG/ACT inhaler Commonly known as: Symbicort  Take 2 puffs first thing in am and then another 2 puffs about 12 hours later.   fluticasone 50 MCG/ACT nasal spray Commonly known as: FLONASE Place 2 sprays into both nostrils as needed.   L-ARGININE PO Take 2 tablets by mouth every morning.   metoprolol  tartrate 100 MG tablet Commonly known as: LOPRESSOR  Take 1 tablet (100 mg total) by mouth once for 1 dose. 2 hours before CT scan   multivitamin with minerals Tabs tablet Take 1 tablet by mouth daily.   sildenafil  100 MG  tablet Commonly known as: VIAGRA  Take 1 tablet (100 mg total) by mouth daily as needed for erectile dysfunction.   valACYclovir 1000 MG tablet Commonly known as: VALTREX Take 1,000 mg by mouth 3 (three) times daily.        Allergies: No Known Allergies  Family History: Family History  Problem Relation Age of Onset   Swallowing difficulties Mother    Hyperthyroidism Mother    COPD Father    Dementia Father    Thyroid  disease Sister    Thyroid  disease Brother    Heart disease Brother        Premature   Thyroid  disease Sister    Thyroid  disease Sister    Thyroid  disease Sister    Colon cancer Neg Hx     Social History:  reports that he has never smoked. He has never used smokeless tobacco. He reports that he does not drink alcohol  and does not use drugs.  ROS: All other review of systems were reviewed and are negative except what is noted above in HPI  Physical Exam: BP 138/69   Pulse 65   Constitutional:  Alert and oriented, No acute distress. HEENT: [place] AT, moist mucus membranes.  Trachea midline, no masses. Cardiovascular: No clubbing, cyanosis, or edema. Respiratory: Normal respiratory effort, no increased work of breathing. GI: Abdomen is  soft, nontender, nondistended, no abdominal masses GU: No CVA tenderness.  Lymph: No cervical or inguinal lymphadenopathy. Skin: No rashes, bruises or suspicious lesions. Neurologic: Grossly intact, no focal deficits, moving all 4 extremities. Psychiatric: Normal mood and affect.  Laboratory Data: Lab Results  Component Value Date   WBC 4.9 08/16/2021   HGB 14.2 08/16/2021   HCT 42.0 08/16/2021   MCV 95 08/16/2021   PLT 165 08/16/2021    Lab Results  Component Value Date   CREATININE 0.84 07/12/2023    No results found for: PSA  No results found for: TESTOSTERONE  No results found for: HGBA1C  Urinalysis    Component Value Date/Time   APPEARANCEUR Clear 04/26/2024 1055   GLUCOSEU Negative 04/26/2024  1055   BILIRUBINUR Negative 04/26/2024 1055   PROTEINUR Negative 04/26/2024 1055   UROBILINOGEN 0.2 10/10/2022 1514   NITRITE Negative 04/26/2024 1055   LEUKOCYTESUR Negative 04/26/2024 1055    Lab Results  Component Value Date   LABMICR Comment 04/26/2024    Pertinent Imaging:  No results found for this or any previous visit.  No results found for this or any previous visit.  No results found for this or any previous visit.  No results found for this or any previous visit.  No results found for this or any previous visit.  No results found for this or any previous visit.  No results found for this or any previous visit.  No results found for this or any previous visit.   Assessment & Plan:    1. Benign prostatic hyperplasia with urinary frequency (Primary) Continue uroxatral  40m qhs - Urinalysis, Routine w reflex microscopic - BLADDER SCAN AMB NON-IMAGING  2. Nocturia -uroxatral  10mg  qhs  3. Erectile dysfunction due to arterial insufficiency Continue sildenafil  100mg  prn   No follow-ups on file.  Belvie Clara, MD  [facility] [place]

## 2024-10-28 NOTE — Patient Instructions (Signed)

## 2024-12-09 ENCOUNTER — Telehealth: Payer: Self-pay

## 2024-12-09 NOTE — Telephone Encounter (Signed)
 Last  refilled 10/28/2024 another refill is not appropriate at this time

## 2024-12-15 ENCOUNTER — Telehealth: Payer: Self-pay

## 2024-12-15 NOTE — Telephone Encounter (Signed)
 SABRA

## 2024-12-16 NOTE — Telephone Encounter (Signed)
 Last Rx sent in 10/28/24 refills no appropriate at this time

## 2024-12-27 ENCOUNTER — Telehealth: Payer: Self-pay

## 2024-12-27 NOTE — Telephone Encounter (Signed)
 Returned phone call to patient who stated he need a refill sent to his pharmacy patient was made aware all prescription were sent on the day he was in office for his appointment patient stated [facility] told him they did not have the refills on file, I called [facility] and spoke with one of the pharmacy techs who stated the rx was in and she would work on getting that in I returned phone call to patient and spoke with patient wife and gave her the message from the pharmacy patient wife stated she would notify patient

## 2025-10-22 ENCOUNTER — Ambulatory Visit: Admitting: Urology
